# Patient Record
Sex: Female | Born: 1980 | Race: White | Hispanic: No | Marital: Single | Smoking: Never smoker
Health system: Southern US, Community
[De-identification: ages and names within clinical notes are randomized; demographics above are authoritative.]

## PROBLEM LIST (undated history)

## (undated) DIAGNOSIS — L732 Hidradenitis suppurativa: Secondary | ICD-10-CM

## (undated) DIAGNOSIS — Z9109 Other allergy status, other than to drugs and biological substances: Secondary | ICD-10-CM

## (undated) DIAGNOSIS — E039 Hypothyroidism, unspecified: Secondary | ICD-10-CM

## (undated) DIAGNOSIS — E785 Hyperlipidemia, unspecified: Secondary | ICD-10-CM

## (undated) DIAGNOSIS — K37 Unspecified appendicitis: Secondary | ICD-10-CM

## (undated) DIAGNOSIS — I10 Essential (primary) hypertension: Secondary | ICD-10-CM

## (undated) DIAGNOSIS — K219 Gastro-esophageal reflux disease without esophagitis: Secondary | ICD-10-CM

## (undated) DIAGNOSIS — D649 Anemia, unspecified: Secondary | ICD-10-CM

## (undated) HISTORY — DX: Hyperlipidemia, unspecified: E78.5

## (undated) HISTORY — DX: Anemia, unspecified: D64.9

## (undated) HISTORY — DX: Gastro-esophageal reflux disease without esophagitis: K21.9

---
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
## 2002-08-19 ENCOUNTER — Ambulatory Visit (HOSPITAL_COMMUNITY): Admission: RE | Admit: 2002-08-19 | Discharge: 2002-08-19 | Payer: Self-pay | Admitting: Gastroenterology

## 2002-08-25 ENCOUNTER — Emergency Department (HOSPITAL_COMMUNITY): Admission: EM | Admit: 2002-08-25 | Discharge: 2002-08-26 | Payer: Self-pay | Admitting: Emergency Medicine

## 2005-04-08 ENCOUNTER — Emergency Department (HOSPITAL_COMMUNITY): Admission: EM | Admit: 2005-04-08 | Discharge: 2005-04-08 | Payer: Self-pay | Admitting: Emergency Medicine

## 2005-10-15 ENCOUNTER — Emergency Department (HOSPITAL_COMMUNITY): Admission: EM | Admit: 2005-10-15 | Discharge: 2005-10-16 | Payer: Self-pay | Admitting: Emergency Medicine

## 2010-06-10 ENCOUNTER — Emergency Department (HOSPITAL_COMMUNITY): Admission: EM | Admit: 2010-06-10 | Discharge: 2010-06-10 | Payer: Self-pay | Admitting: Emergency Medicine

## 2011-05-23 ENCOUNTER — Inpatient Hospital Stay (HOSPITAL_COMMUNITY)
Admission: EM | Admit: 2011-05-23 | Discharge: 2011-05-25 | DRG: 343 | Disposition: A | Discharge status: Home / Self Care | Payer: Self-pay | Attending: General Surgery | Admitting: General Surgery

## 2011-05-23 DIAGNOSIS — K3580 Unspecified acute appendicitis: Principal | ICD-10-CM | POA: Diagnosis present

## 2011-05-23 LAB — COMPREHENSIVE METABOLIC PANEL
Albumin: 4 g/dL (ref 3.5–5.2)
Alkaline Phosphatase: 102 U/L (ref 39–117)
BUN: 7 mg/dL (ref 6–23)
CO2: 24 mEq/L (ref 19–32)
Chloride: 101 mEq/L (ref 96–112)
Creatinine, Ser: 0.58 mg/dL (ref 0.50–1.10)
GFR calc non Af Amer: >60 mL/min (ref >60)
Potassium: 3.7 mEq/L (ref 3.5–5.1)
Total Bilirubin: 0.5 mg/dL (ref 0.3–1.2)

## 2011-05-23 LAB — CBC
HCT: 40.4 % (ref 36.0–46.0)
Hemoglobin: 13.5 g/dL (ref 12.0–15.0)
MCH: 28.8 pg (ref 26.0–34.0)
MCV: 86.1 fL (ref 78.0–100.0)
RBC: 4.69 MIL/uL (ref 3.87–5.11)
WBC: 15.9 10*3/uL — ABNORMAL HIGH (ref 4.0–10.5)

## 2011-05-23 LAB — DIFFERENTIAL
Eosinophils Absolute: 0.1 10*3/uL (ref 0.0–0.7)
Lymphocytes Relative: 14 % (ref 12–46)
Lymphs Abs: 2.2 10*3/uL (ref 0.7–4.0)
Monocytes Relative: 6 % (ref 3–12)
Neutro Abs: 12.6 10*3/uL — ABNORMAL HIGH (ref 1.7–7.7)
Neutrophils Relative %: 79 % — ABNORMAL HIGH (ref 43–77)

## 2011-05-23 LAB — URINALYSIS, ROUTINE W REFLEX MICROSCOPIC
Bilirubin Urine: NEGATIVE
Glucose, UA: NEGATIVE mg/dL
Hgb urine dipstick: NEGATIVE
Ketones, ur: NEGATIVE mg/dL
Specific Gravity, Urine: 1.014 (ref 1.005–1.030)
pH: 5.5 (ref 5.0–8.0)

## 2011-05-24 ENCOUNTER — Other Ambulatory Visit (INDEPENDENT_AMBULATORY_CARE_PROVIDER_SITE_OTHER): Payer: Self-pay | Admitting: General Surgery

## 2011-05-24 ENCOUNTER — Emergency Department (HOSPITAL_COMMUNITY): Payer: Self-pay

## 2011-05-24 ENCOUNTER — Encounter (HOSPITAL_COMMUNITY): Payer: Self-pay

## 2011-05-24 DIAGNOSIS — K3580 Unspecified acute appendicitis: Secondary | ICD-10-CM

## 2011-05-24 DIAGNOSIS — R112 Nausea with vomiting, unspecified: Secondary | ICD-10-CM

## 2011-05-24 DIAGNOSIS — R1031 Right lower quadrant pain: Secondary | ICD-10-CM

## 2011-05-24 HISTORY — PX: APPENDECTOMY: SHX54

## 2011-05-24 LAB — MRSA PCR SCREENING: MRSA by PCR: NEGATIVE

## 2011-05-24 MED ORDER — IOHEXOL 300 MG/ML  SOLN
100.0000 mL | Freq: Once | INTRAMUSCULAR | Status: AC | PRN
Start: 2011-05-24 — End: 2011-05-24
  Administered 2011-05-24: 100 mL via INTRAVENOUS

## 2011-05-25 NOTE — Op Note (Signed)
[name], [name]                ACCOUNT NO.:  [id number]  MEDICAL RECORD NO.:  [id number]  LOCATION:  1507                         FACILITY:  [facility]  PHYSICIAN:  Adolph Pollack, M.D.DATE OF BIRTH:  1981-03-23  DATE OF PROCEDURE:  05/24/2011 DATE OF DISCHARGE:                              OPERATIVE REPORT   PREOPERATIVE DIAGNOSIS:  Acute appendicitis.  POSTOPERATIVE DIAGNOSIS:  Acute appendicitis.  PROCEDURE:  Laparoscopic appendectomy.  SURGEON:  Adolph Pollack, M.D.  ANESTHESIA:  General.  INDICATIONS:  This is a 30 year old female with 3-day history of progressively increasing periumbilical pain that had migrated to the right lower quadrant.  She was sent to [facility] Emergency Department, was found to have acute appendicitis by exam and CT scan, is now brought to the operating room for the above procedure.  TECHNIQUE:  She was brought to the operative room, placed supine on the operating table and general anesthetic was administered.  A Foley catheter was inserted into the bladder.  The abdominal wall sterilely prepped and draped.  Superior to the umbilicus, I infiltrated Marcaine solution into the subcutaneous tissues.  A small supraumbilical incision was made through the skin and subcutaneous tissue down to the fascia.  A small incision was made in the fascia, which was then retracted anteriorly.  The peritoneal cavity was then entered.  A pursestring suture of 0 Vicryl was placed around the fascial edges.  A Hassan trocar was introduced to the peritoneal cavity.  Pneumoperitoneum created by insufflation of CO2 gas.  Laparoscope was introduced and there was no underlying bleeding or organ injury.  There was no evidence of abscess or purulent drainage.  A 5-mm trocar was then placed in the left lower quadrant and I manipulated the cecum and noted adherent appendix to the antimesenteric fat of the distal ileum as well as the sidewall, which I was able to  mobilize free using blunt dissection.  A 5-mm trocar was then placed in the right upper quadrant.  I grasped the mesoappendix and retracted anteriorly. Using harmonic scalpel, I divided the mesoappendix down to the base of the appendix.  Using an Endo-GIA stapler, I then amputated the appendix off the cecum taking a small cuff of cecum.  The appendix was then placed in Endopouch bag and it was removed through the supraumbilical incision.  The supraumbilical port was replaced.  I then inspected the staple line and it was solid without evidence of leak or bleeding.  I then copiously irrigated out the right lower quadrant and evacuated the fluid.  Following this, I removed the supraumbilical trocar and left lower quadrant trocar.  Under laparoscopic vision, I closed the supraumbilical fascial defect by tightening up and tying down the pursestring suture.  The CO2 gas was released and the trocar was removed.  The skin incisions were then closed with 4-0 Monocryl subcuticular stitches.  Steri-Strips and sterile dressings were applied.  She tolerated the procedure well without any apparent complications and was taken to recovery room in satisfactory condition.     Adolph Pollack, M.D.     Kari Baars  D:  05/24/2011  T:  05/24/2011  Job:  [id number]  Electronically Signed by  Avel Peace M.D. on 05/25/2011 05:16:25 PM

## 2011-05-25 NOTE — H&P (Signed)
Harper, Meagan                ACCOUNT NO.:  [id number]  MEDICAL RECORD NO.:  [id number]  LOCATION:  WLED                         FACILITY:  [facility]  PHYSICIAN:  Adolph Pollack, M.D.DATE OF BIRTH:  11/21/1980  DATE OF ADMISSION:  05/23/2011 DATE OF DISCHARGE:                             HISTORY & PHYSICAL   REASON FOR ADMISSION:  Acute appendicitis.  HISTORY OF PRESENT ILLNESS:  [name] is a 30 year old female with the onset of periumbilical abdominal pain since May 20, 2011.  It progressively worsened and then radiated and then migrated to the right lower quadrant.  She had some nausea and low grade fever and anorexia. She presented to the emergency department for evaluation and was noted to have an elevation of her white blood cell count and right lower quadrant tenderness.  The CT scan demonstrated acute appendicitis.  We were asked to see her for that reason.  PAST MEDICAL HISTORY: 1. Gastroesophageal reflux disease. 2. Asthma.  PREVIOUS OPERATIONS:  Bilateral myringotomy tubes.  ALLERGIES:  She has sensitivity to ASPIRIN and IBUPROFEN.  Also, to [facility].  MEDICATIONS: 1. Iron. 2. Prilosec. 3. Albuterol inhaler p.r.n..  SOCIAL HISTORY:  She is single and working.  Her family is here with her.  No tobacco or alcohol use.  REVIEW OF SYSTEMS:  CARDIAC:  No hypertension or heart disease. PULMONARY:  No pneumonia, TB. GI: No peptic ulcer disease.  No diverticulitis.  No hepatitis. GU:  No kidney stones.  No dysuria, hematuria. ENDOCRINE:  No diabetes or hypercholesterolemia. NEUROLOGIC:  No seizures. HEMATOLOGIC:  No bleeding disorders or blood clots.  PHYSICAL EXAMINATION:  GENERAL:  An overweight female, who is in no acute distress currently.  She does receive pain medication.  She is awake, alert and cooperative. VITAL SIGNS:  Temperature is 99.2, blood pressure is 135/87, pulse 99, respiratory rate 18, O2 sats 99% on room air. SKIN:  Warm, dry.  No  jaundice. HEENT:  Normocephalic, atraumatic. NECK:  Supple without masses or obvious thyroid nodules. RESPIRATORY:  Breath sounds are equal and clear.  Respirations unlabored. CARDIOVASCULAR:  Heart demonstrates slightly increased rate with regular rhythm.  There is no JVD. ABDOMEN:  Soft.  There is some mild tenderness to palpation and percussion of the right lower quadrant with no palpable mass.  No hernia and active bowel sounds are noted. MUSCULOSKELETAL:  No edema.  Good range of motion and muscle tone. SKIN:  No jaundice.  LABORATORY DATA:  White cell count 15,900.  Hemoglobin 13.5.  Glucose is 102 except other than electrolytes and liver function tests are within normal limits.  Urine pregnancy test is negative.  Urinalysis negative.  DIAGNOSTIC STUDIES:  CT scan demonstrates thickened appendix with some inflammatory changes, but no evidence of abscess, a very small amount of free pelvic fluid.  IMPRESSION:  Acute appendicitis - Does not appear perforated at this time.  No signs of diffuse peritonitis.  PLAN:  Admit to the hospital.  Start IV antibiotics (IV cefoxitin has been given in the emergency department).  Laparoscopic possible open appendectomy.  I have discussed the procedure risks with her and her family.  Risks include but are not limited to bleeding,  infection, wound healing problems, anesthesia, accidental damage to intra-abdominal organs.  We also discussed aftercare and off work time.  She seems to understand and agrees with the plan.     Adolph Pollack, M.D.     Kari Baars  D:  05/24/2011  T:  05/24/2011  Job:  [id number]  Electronically Signed by Avel Peace M.D. on 05/25/2011 05:16:00 PM

## 2011-06-07 ENCOUNTER — Encounter (INDEPENDENT_AMBULATORY_CARE_PROVIDER_SITE_OTHER): Payer: Self-pay

## 2011-06-07 ENCOUNTER — Telehealth (INDEPENDENT_AMBULATORY_CARE_PROVIDER_SITE_OTHER): Payer: Self-pay

## 2011-06-07 NOTE — Telephone Encounter (Signed)
Pt called requesting a RTW note to be faxed to her job b/c her follow up appt has been changed by our office. I created the note in epic and faxed to pt per her request fx#[phone]/ [facility]

## 2011-06-08 NOTE — Discharge Summary (Signed)
  [name], [name]                ACCOUNT NO.:  [id number]  MEDICAL RECORD NO.:  [id number]  LOCATION:  1507                         FACILITY:  [facility]  PHYSICIAN:  Anselm Pancoast. Rasheda Ledger, M.D.DATE OF BIRTH:  Mar 24, 1981  DATE OF ADMISSION:  05/23/2011 DATE OF DISCHARGE:  05/25/2011                              DISCHARGE SUMMARY   ADMISSION DIAGNOSES: 1. Acute appendicitis. 2. History of gastroesophageal reflux disease. 3. History of asthma.  DISCHARGE DIAGNOSES: 1. Acute appendicitis. 2. History of gastroesophageal reflux disease. 3. History of asthma.  PROCEDURE:  Laparoscopic appendectomy on May 24, 2011.  BRIEF HISTORY:  The patient is a 30 year old female with a history of periumbilical pain started on July 27th, became progressively worse, radiated and migrated to right lower quadrant.  She has had some nausea and low grade fevers and anorexia.  She presented to the ER where a CT demonstrated acute appendicitis.  Dr. Abbey Chatters was asked to see the patient and after evaluation, recommended she go to the OR for a laparoscopic appendectomy.  She was started on IV antibiotics in the ER.  She agrees to procedure and was subsequently admitted.  PAST MEDICAL HISTORY:  As above.  ALLERGIES: 1. ASPIRIN. 2. IBUPROFEN. 3. SHELLFISH.  MEDICATIONS:  Iron supplement, Prilosec, and albuterol p.r.n.  For further history and physical, please see the dictated note.  HOSPITAL COURSE:  The patient was taken the OR in the early a.m. of May 24, 2011, at which time Dr. Abbey Chatters did a laparoscopic appendectomy. The patient tolerated the procedure well, was transferred to the floor postoperatively.  She has been mobilized advanced.  She ate eggs and muffin for breakfast.  Her wounds were examined by Dr. Zachery Dakins, he felt her abdomen was soft and was healing nicely and it was his impression, The patient could be discharged home today.  DISCHARGE MEDICATIONS:  She will continue her  albuterol inhaler 2 puffs q.4 p.r.n., over-the-counter iron 2 tablets daily, Prilosec 2 tablets daily, Tylenol Extra Strength p.r.n., and new prescription will be Percocet 1 to 2 p.o. q.4 h. p.r.n., #40, no refills.  She will follow up in the office in about 2 weeks and we will work on making that appointment for her.  She is to call if she has any problems with fever, urinary retention, abdominal pain, or problems with her wound sites.  CONDITION ON DISCHARGE:  Improved.     Eber Hong, P.A.   ______________________________ Anselm Pancoast. Zachery Dakins, M.D.    WDJ/MEDQ  D:  05/25/2011  T:  05/25/2011  Job:  [id number]  cc:   Lovenia Kim, D.O. Fax: [phone]  Electronically Signed by Sherrie George P.A. on 05/30/2011 10:23:36 PM Electronically Signed by Consuello Bossier M.D. on 06/08/2011 09:18:59 AM

## 2011-06-09 ENCOUNTER — Telehealth (INDEPENDENT_AMBULATORY_CARE_PROVIDER_SITE_OTHER): Payer: Self-pay | Admitting: General Surgery

## 2011-06-09 NOTE — Telephone Encounter (Signed)
Patient called s/p lap appy on 05/24/11. States her incision around her belly button is a little red and tender. Started yesterday. No spreading of the redness. No drainage. No fever. I explained to patient that this far out would be abnormal to develop an infection and the incision is probably just irritated since the redness is just right at the incision. Patient still wants her appt moved up if possible. Scheduled for 06/22/11. I could not find any openings prior to this. Please advise.

## 2011-06-22 ENCOUNTER — Encounter (INDEPENDENT_AMBULATORY_CARE_PROVIDER_SITE_OTHER): Payer: Self-pay | Admitting: General Surgery

## 2011-06-22 ENCOUNTER — Encounter (INDEPENDENT_AMBULATORY_CARE_PROVIDER_SITE_OTHER): Payer: Self-pay

## 2011-06-22 ENCOUNTER — Ambulatory Visit (INDEPENDENT_AMBULATORY_CARE_PROVIDER_SITE_OTHER): Payer: Self-pay | Admitting: General Surgery

## 2011-06-22 ENCOUNTER — Other Ambulatory Visit (INDEPENDENT_AMBULATORY_CARE_PROVIDER_SITE_OTHER): Payer: Self-pay

## 2011-06-22 VITALS — BP 112/86 | HR 62

## 2011-06-22 DIAGNOSIS — K37 Unspecified appendicitis: Secondary | ICD-10-CM

## 2011-06-22 HISTORY — DX: Unspecified appendicitis: K37

## 2011-06-22 NOTE — Progress Notes (Signed)
Operation: Laparoscopic appendectomy  Date: May 24, 2011  Pathology: Acute appendicitis  HPI:  [name] is here for her first postoperative visit. She is doing well and has no complaints.   Physical Exam: Abdomen-soft, incisions clean, dry, intact. No hernia   Assessment: Doing well following appendectomy.  Plan: Activities as tolerated. Return visit p.r.n.

## 2011-06-22 NOTE — Patient Instructions (Signed)
May return to work without restrictions. Activities as tolerated.

## 2011-09-14 IMAGING — CT CT ABD-PELV W/ CM
2 of 4 series · 17 of 46 positions shown, 19 images · IV contrast (agent unspecified)
Comparison: None.

CLINICAL DATA: Right lower quadrant pain.  Elevated white count

CT ABDOMEN AND PELVIS WITH CONTRAST
TECHNIQUE: Multidetector CT imaging of the abdomen and pelvis was
performed following the standard protocol during bolus
administration of intravenous contrast.
Contrast: 100 ml Smnipaque-BKK IV

[Series 2: rtn ap with st · axial · 0.60mm/px · z∈[-218,+156]mm · 14 of 83 slices shown, 16 images]
[im 4/83  soft-tissue]
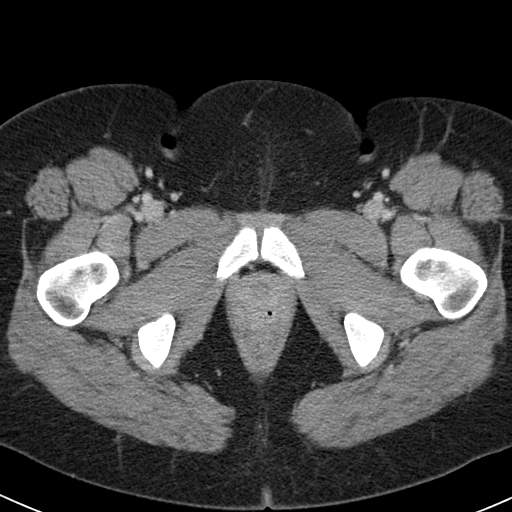
[im 4/83  bone]
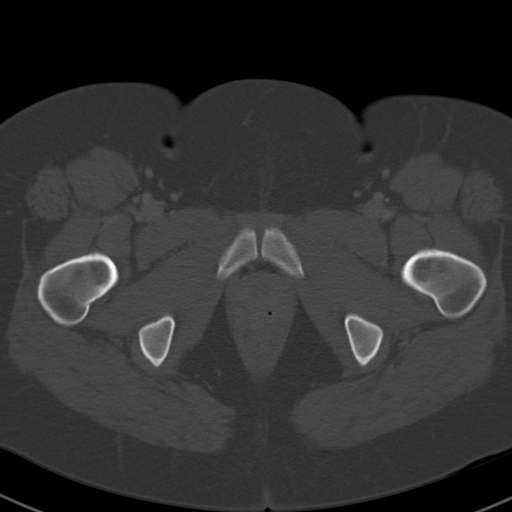
[im 10/83  soft-tissue]
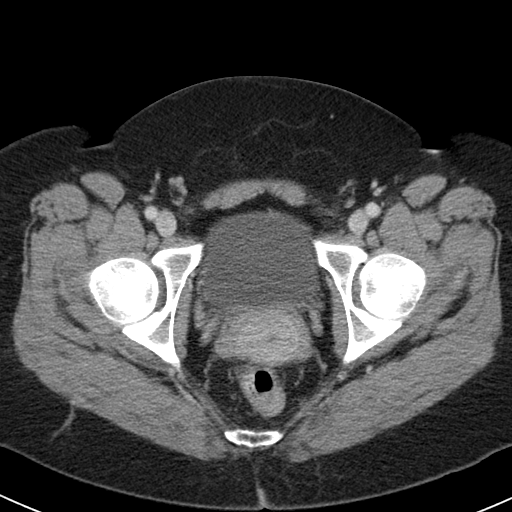
[im 17/83  soft-tissue]
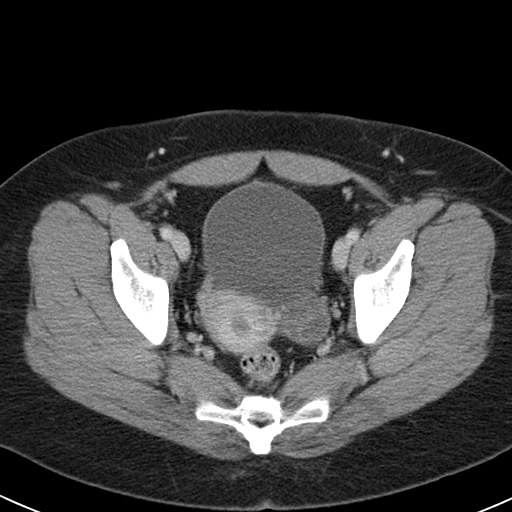
[im 23/83  soft-tissue]
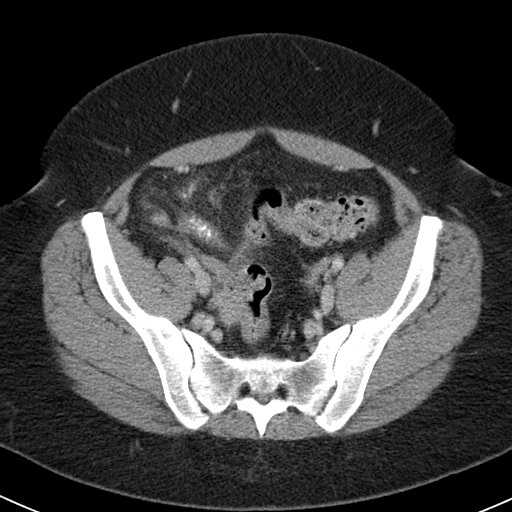
[im 27/83  soft-tissue]
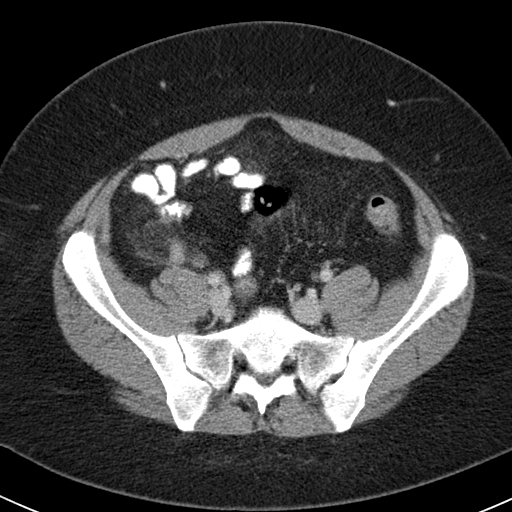
[im 33/83  soft-tissue]
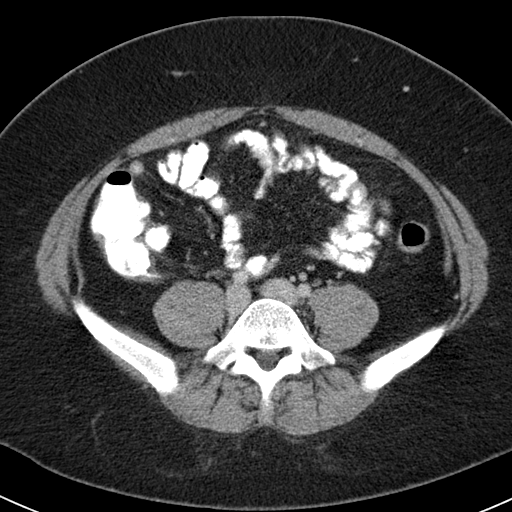
[im 40/83  soft-tissue]
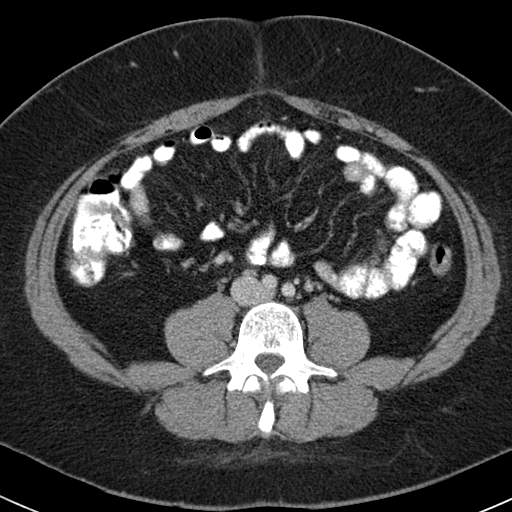
[im 43/83  soft-tissue]
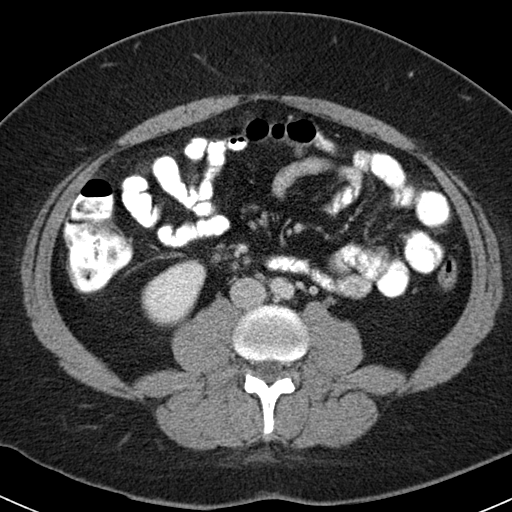
[im 50/83  soft-tissue]
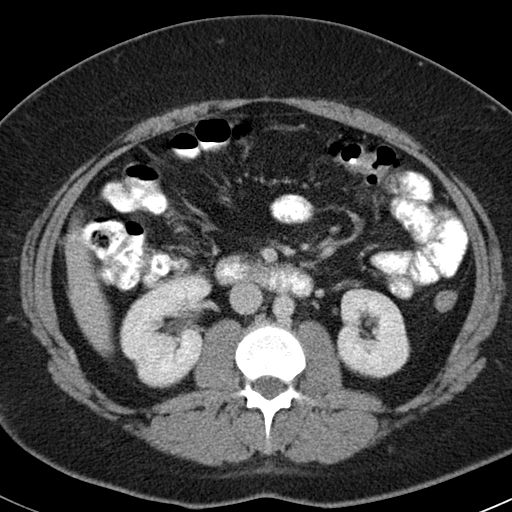
[im 50/83  bone]
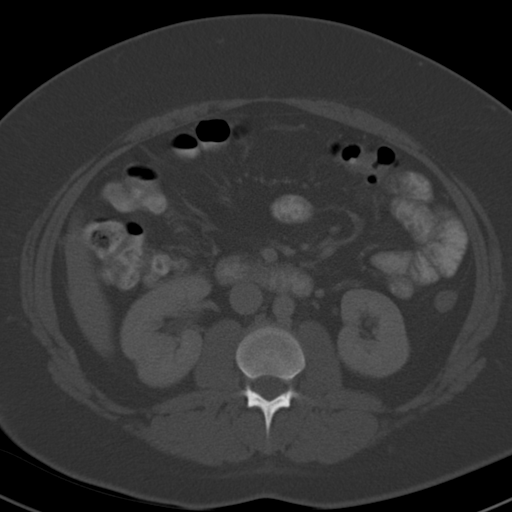
[im 56/83  soft-tissue]
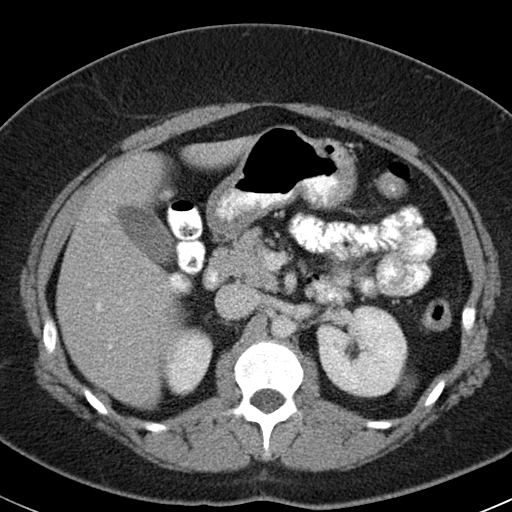
[im 63/83  soft-tissue]
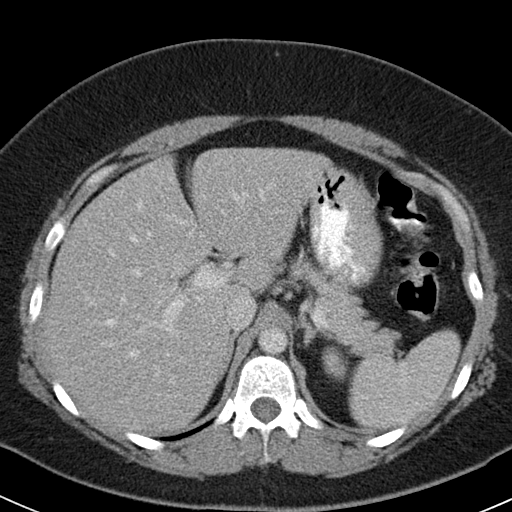
[im 66/83  soft-tissue]
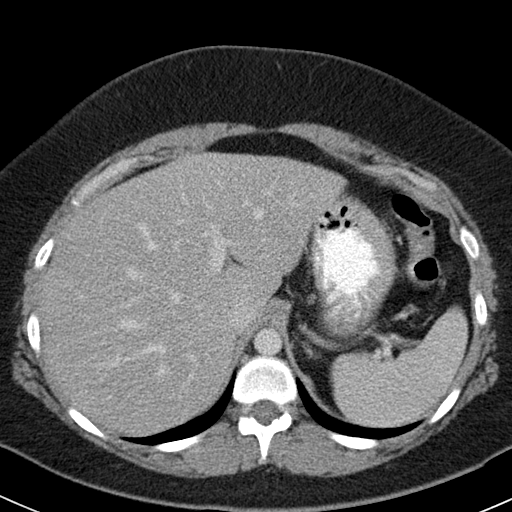
[im 73/83  soft-tissue]
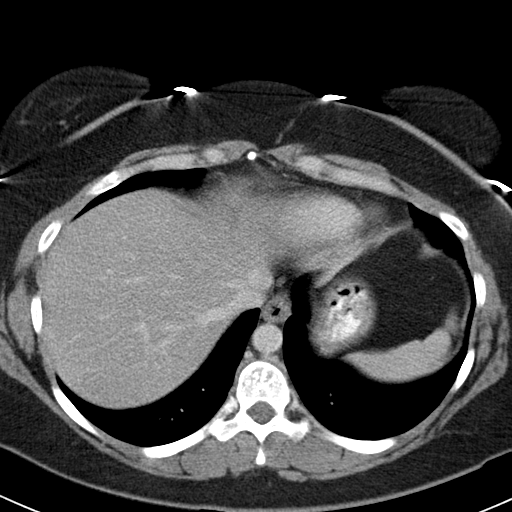
[im 79/83  soft-tissue]
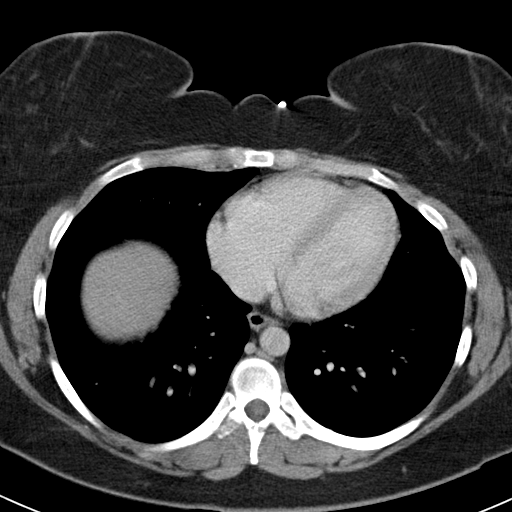

[Series 602: <mpr thick range> · coronal · 0.84mm/px · 3 of 77 slices shown]
[im 26/77  soft-tissue]
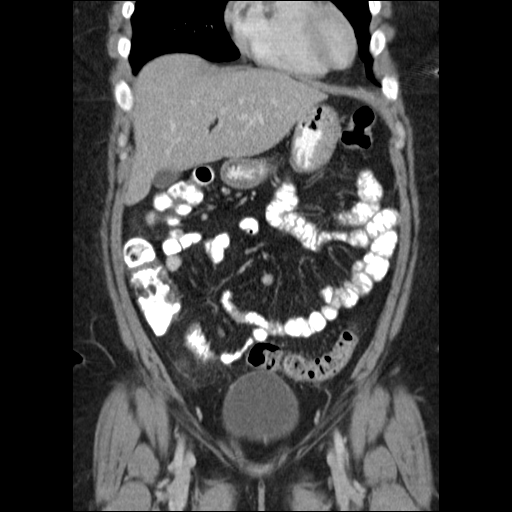
[im 34/77  soft-tissue]
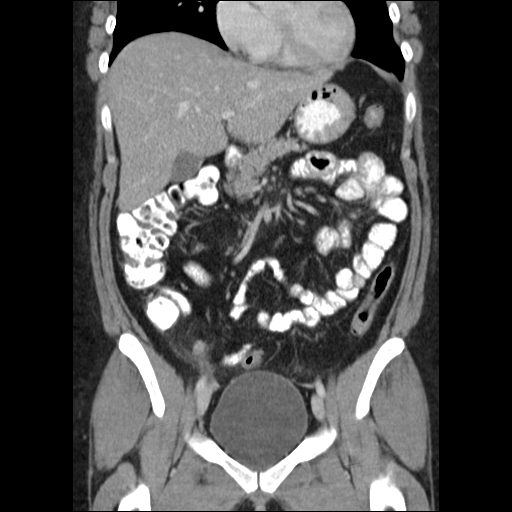
[im 43/77  soft-tissue]
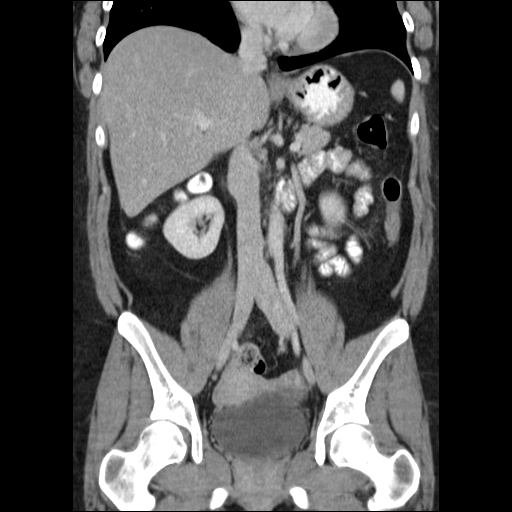

[17 of 46 positions shown; findings below may reference images not displayed]

FINDINGS: Findings compatible with acute appendicitis.  The
appendix is thickened with edema in the periappendiceal fat.
Minimal free fluid in the cul-de-sac.  No abscess is present.
Negative for bowel obstruction.

Liver and spleen are normal.  Gallbladder and bile ducts are
normal.  Pancreas is normal.  Kidneys are normal.  Negative for
mass or adenopathy. Left ovary is mildly enlarged due to small
follicles.  Uterus is normal.
IMPRESSION: Acute appendicitis without abscess.

## 2013-10-07 ENCOUNTER — Ambulatory Visit (INDEPENDENT_AMBULATORY_CARE_PROVIDER_SITE_OTHER): Payer: BC Managed Care – PPO | Admitting: Emergency Medicine

## 2013-10-07 ENCOUNTER — Encounter: Payer: Self-pay | Admitting: Emergency Medicine

## 2013-10-07 VITALS — BP 126/94 | HR 100 | Temp 98.6°F | Resp 16 | Wt 205.0 lb

## 2013-10-07 DIAGNOSIS — J329 Chronic sinusitis, unspecified: Secondary | ICD-10-CM

## 2013-10-07 DIAGNOSIS — J309 Allergic rhinitis, unspecified: Secondary | ICD-10-CM

## 2013-10-07 MED ORDER — CEPHALEXIN 500 MG PO CAPS: 500.0000 mg | ORAL_CAPSULE | Freq: Three times a day (TID) | ORAL | Status: AC

## 2013-10-07 NOTE — Patient Instructions (Signed)
Sinusitis Sinusitis is redness, soreness, and puffiness (inflammation) of the air pockets in the bones of your face (sinuses). The redness, soreness, and puffiness can cause air and mucus to get trapped in your sinuses. This can allow germs to grow and cause an infection.  HOME CARE   Drink enough fluids to keep your pee (urine) clear or pale yellow.  Use a humidifier in your home.  Run a hot shower to create steam in the bathroom. Sit in the bathroom with the door closed. Breathe in the steam 3 4 times a day.  Put a warm, moist washcloth on your face 3 4 times a day, or as told by your doctor.  Use salt water sprays (saline sprays) to wet the thick fluid in your nose. This can help the sinuses drain.  Only take medicine as told by your doctor. GET HELP RIGHT AWAY IF:   Your pain gets worse.  You have very bad headaches.  You are sick to your stomach (nauseous).  You throw up (vomit).  You are very sleepy (drowsy) all the time.  Your face is puffy (swollen).  Your vision changes.  You have a stiff neck.  You have trouble breathing. MAKE SURE YOU:   Understand these instructions.  Will watch your condition.  Will get help right away if you are not doing well or get worse. Document Released: 03/28/2008 Document Revised: 07/04/2012 Document Reviewed: 05/15/2012 [employer] Patient Information 2014 [employer].  

## 2013-10-08 NOTE — Progress Notes (Signed)
   Subjective:    Patient ID: [name], female    DOB: 1981/04/10, 32 y.o.   MRN: [id number]  HPI Comments: 32 yo female with left sided facial pain and swelling x 3days. She has had cold for over 2 weeks and now has color with production. She has been taking Zyrtec and mucinex w/o relief.  Sinusitis Associated symptoms include congestion, headaches and sinus pressure.  Headache  Associated symptoms include sinus pressure.    Current Outpatient Prescriptions on File Prior to Visit  Medication Sig Dispense Refill  . IRON PO Take 45 mg by mouth 2 (two) times daily.         No current facility-administered medications on file prior to visit.  OMEPRAZOLE 20  ALLERGIES Aspirin; Avelox; Ibuprofen; and Shellfish allergy  Past Medical History  Diagnosis Date  . Anemia   . Asthma   . GERD (gastroesophageal reflux disease)   . Hyperlipidemia       Review of Systems  HENT: Positive for congestion and sinus pressure.   Respiratory: Negative for choking.   Neurological: Positive for headaches.  All other systems reviewed and are negative.    BP 126/94  Pulse 100  Temp(Src) 98.6 F (37 C) (Temporal)  Resp 16  Wt 205 lb (92.987 kg)  LMP 09/19/2013     Objective:   Physical Exam  Nursing note and vitals reviewed. Constitutional: She is oriented to person, place, and time. She appears well-developed and well-nourished.  HENT:  Head: Normocephalic and atraumatic.  Right Ear: External ear normal.  Left Ear: External ear normal.  Nose: Nose normal.  Mouth/Throat: Oropharynx is clear and moist. No oropharyngeal exudate.  Left maxillary tenderness with mild edema  Eyes: Conjunctivae and EOM are normal.  Neck: Normal range of motion.  Cardiovascular: Normal rate, regular rhythm, normal heart sounds and intact distal pulses.   Pulmonary/Chest: Effort normal and breath sounds normal.  Musculoskeletal: Normal range of motion.  Lymphadenopathy:    She has no cervical  adenopathy.  Neurological: She is alert and oriented to person, place, and time.  Skin: Skin is warm and dry.  Psychiatric: She has a normal mood and affect. Judgment normal.          Assessment & Plan:  Sinusitits/ Allergic Rhinitis- Keflex 500 mg TID #30 with food, Increase H2o, continue Mucinex AD, switch to Allegra AD.

## 2014-01-17 ENCOUNTER — Other Ambulatory Visit: Payer: Self-pay | Admitting: Emergency Medicine

## 2014-01-17 ENCOUNTER — Telehealth: Payer: Self-pay | Admitting: *Deleted

## 2014-01-17 NOTE — Telephone Encounter (Signed)
REFILL :  EPI PEN  &  VENTOLIN     [facility] [place] ( [address] )

## 2014-01-19 NOTE — Telephone Encounter (Signed)
We do not have theses prescriptions listed, she will need to call pharmacy and have them request refill

## 2015-02-18 ENCOUNTER — Encounter: Payer: Self-pay | Admitting: Internal Medicine

## 2015-02-18 ENCOUNTER — Ambulatory Visit (INDEPENDENT_AMBULATORY_CARE_PROVIDER_SITE_OTHER): Payer: Self-pay | Admitting: Internal Medicine

## 2015-02-18 VITALS — BP 152/86 | HR 86 | Temp 98.4°F | Resp 18 | Ht 58.25 in | Wt 221.0 lb

## 2015-02-18 DIAGNOSIS — R21 Rash and other nonspecific skin eruption: Secondary | ICD-10-CM

## 2015-02-18 MED ORDER — TRIAMCINOLONE ACETONIDE 0.1 % EX CREA: 1.0000 "application " | TOPICAL_CREAM | Freq: Two times a day (BID) | CUTANEOUS | Status: DC

## 2015-02-18 MED ORDER — PREDNISONE 20 MG PO TABS: ORAL_TABLET | ORAL | Status: DC

## 2015-02-18 NOTE — Progress Notes (Signed)
   Subjective:    Patient ID: [name], female    DOB: April 27, 1981, 34 y.o.   MRN: [id number]  Rash Pertinent negatives include no fatigue, fever, shortness of breath or vomiting.   Patient is a 34 y.o. Female who presents to the office for evaluation of a rash. She developed it on Monday.  She reports that it has been spreading.  It is on her neck, arms, chest and back.  It is itchy, red.  She reports that she doe sometimes have a similar rash when she is in the sun and was exposed to the sun on Saturday.  She has tried benadryl and also some hydrocortisone cream at home.  Nothing makes it worse.  She has no changes in personal products, no travel, and no plants. She reports no new foods.      Review of Systems  Constitutional: Negative for fever, chills and fatigue.  HENT: Negative for facial swelling.   Respiratory: Negative for chest tightness, shortness of breath and wheezing.   Gastrointestinal: Negative for nausea and vomiting.  Skin: Positive for color change and rash. Negative for pallor and wound.       Objective:   Physical Exam  Constitutional: She is oriented to person, place, and time. She appears well-developed and well-nourished. No distress.  HENT:  Head: Normocephalic and atraumatic.  Mouth/Throat: Oropharynx is clear and moist. No oropharyngeal exudate.  Eyes: Conjunctivae and EOM are normal. Pupils are equal, round, and reactive to light. No scleral icterus.  Neck: Normal range of motion. Neck supple. No JVD present. No thyromegaly present.  Cardiovascular: Normal rate, regular rhythm, normal heart sounds and intact distal pulses.  Exam reveals no gallop and no friction rub.   No murmur heard. Pulmonary/Chest: Effort normal and breath sounds normal. No respiratory distress. She has no wheezes. She has no rales. She exhibits no tenderness.  Musculoskeletal: Normal range of motion.  Lymphadenopathy:    She has no cervical adenopathy.  Neurological: She is alert  and oriented to person, place, and time.  Skin: Skin is warm and dry. Rash noted. She is not diaphoretic.  Erythematous maculopapular rash of the chest neck and bilateral arms.  Blanches with pressure, no purpura or petechia.  There is no tenderness to palpation.    Psychiatric: She has a normal mood and affect. Her behavior is normal. Judgment and thought content normal.  Nursing note and vitals reviewed.   Filed Vitals:   02/18/15 0934  BP: 152/86  Pulse: 86  Temp: 98.4 F (36.9 C)  Resp: 18         Assessment & Plan:    1. Rash and nonspecific skin eruption  Likely rash from sun exposure on Sunday vs. Contact dermatitis.  Will treat with steroids, oral antihistamine and topical triamcinolone for itching.    - predniSONE (DELTASONE) 20 MG tablet; 3 tabs po day one, then 2 tabs daily x 4 days  Dispense: 11 tablet; Refill: 0 - triamcinolone cream (KENALOG) 0.1 %; Apply 1 application topically 2 (two) times daily.  Dispense: 30 g; Refill: 0 -OTC antihistamine

## 2015-02-18 NOTE — Patient Instructions (Signed)

## 2015-10-30 ENCOUNTER — Ambulatory Visit (INDEPENDENT_AMBULATORY_CARE_PROVIDER_SITE_OTHER): Payer: Self-pay | Admitting: Internal Medicine

## 2015-10-30 ENCOUNTER — Encounter: Payer: Self-pay | Admitting: Internal Medicine

## 2015-10-30 VITALS — BP 128/76 | HR 102 | Temp 97.8°F | Resp 16 | Ht 58.25 in | Wt 220.0 lb

## 2015-10-30 DIAGNOSIS — R21 Rash and other nonspecific skin eruption: Secondary | ICD-10-CM

## 2015-10-30 MED ORDER — PREDNISONE 20 MG PO TABS: ORAL_TABLET | ORAL | Status: DC

## 2015-10-30 MED ORDER — BETAMETHASONE VALERATE 0.1 % EX OINT: 1.0000 "application " | TOPICAL_OINTMENT | Freq: Two times a day (BID) | CUTANEOUS | Status: DC

## 2015-10-30 NOTE — Patient Instructions (Signed)
Eczema Eczema, also called atopic dermatitis, is a skin disorder that causes inflammation of the skin. It causes a red rash and dry, scaly skin. The skin becomes very itchy. Eczema is generally worse during the cooler winter months and often improves with the warmth of summer. Eczema usually starts showing signs in infancy. Some children outgrow eczema, but it may last through adulthood.  CAUSES  The exact cause of eczema is not known, but it appears to run in families. People with eczema often have a family history of eczema, allergies, asthma, or hay fever. Eczema is not contagious. Flare-ups of the condition may be caused by:   Contact with something you are sensitive or allergic to.   Stress. SIGNS AND SYMPTOMS  Dry, scaly skin.   Red, itchy rash.   Itchiness. This may occur before the skin rash and may be very intense.  DIAGNOSIS  The diagnosis of eczema is usually made based on symptoms and medical history. TREATMENT  Eczema cannot be cured, but symptoms usually can be controlled with treatment and other strategies. A treatment plan might include:  Controlling the itching and scratching.   Use over-the-counter antihistamines as directed for itching. This is especially useful at night when the itching tends to be worse.   Use over-the-counter steroid creams as directed for itching.   Avoid scratching. Scratching makes the rash and itching worse. It may also result in a skin infection (impetigo) due to a break in the skin caused by scratching.   Keeping the skin well moisturized with creams every day. This will seal in moisture and help prevent dryness. Lotions that contain alcohol and water should be avoided because they can dry the skin.   Limiting exposure to things that you are sensitive or allergic to (allergens).   Recognizing situations that cause stress.   Developing a plan to manage stress.  HOME CARE INSTRUCTIONS   Only take over-the-counter or  prescription medicines as directed by your health care provider.   Do not use anything on the skin without checking with your health care provider.   Keep baths or showers short (5 minutes) in warm (not hot) water. Use mild cleansers for bathing. These should be unscented. You may add nonperfumed bath oil to the bath water. It is best to avoid soap and bubble bath.   Immediately after a bath or shower, when the skin is still damp, apply a moisturizing ointment to the entire body. This ointment should be a petroleum ointment. This will seal in moisture and help prevent dryness. The thicker the ointment, the better. These should be unscented.   Keep fingernails cut short. Children with eczema may need to wear soft gloves or mittens at night after applying an ointment.   Dress in clothes made of cotton or cotton blends. Dress lightly, because heat increases itching.   A child with eczema should stay away from anyone with fever blisters or cold sores. The virus that causes fever blisters (herpes simplex) can cause a serious skin infection in children with eczema. SEEK MEDICAL CARE IF:   Your itching interferes with sleep.   Your rash gets worse or is not better within 1 week after starting treatment.   You see pus or soft yellow scabs in the rash area.   You have a fever.   You have a rash flare-up after contact with someone who has fever blisters.    This information is not intended to replace advice given to you by your health care   provider. Make sure you discuss any questions you have with your health care provider.   Document Released: 10/07/2000 Document Revised: 07/31/2013 Document Reviewed: 05/13/2013 Elsevier Interactive Patient Education 2016 [employer]  

## 2015-10-30 NOTE — Progress Notes (Signed)
   Subjective:    Patient ID: [name], female    DOB: Jan 12, 1981, 35 y.o.   MRN: [id number]  Rash Pertinent negatives include no fatigue, fever or vomiting.   Patient presents to the office for evaluation of a rash on her abdomen which has been going on for the past couple months.  She reports that the rash is around her belly button and seems to wax and wane.  She reports that she has been trying kenalog, hydrocortisone cream, essential oils and nothing helps.  No soaps, no new detergents, no new clothes.  She reports that rash gets worse with heat.  She reports that the kenalog cream helped the most.  Rash is itchy, red. No blisters, no discharge.  No similar rash with family or friends.  She does have a history of eczema.    Review of Systems  Constitutional: Negative for fever, chills and fatigue.  Gastrointestinal: Negative for nausea and vomiting.  Skin: Positive for rash. Negative for color change, pallor and wound.       Objective:   Physical Exam  Constitutional: She is oriented to person, place, and time. She appears well-developed and well-nourished. No distress.  HENT:  Head: Normocephalic.  Mouth/Throat: Oropharynx is clear and moist. No oropharyngeal exudate.  Eyes: Conjunctivae are normal. No scleral icterus.  Neck: Normal range of motion. Neck supple. No JVD present. No thyromegaly present.  Cardiovascular: Normal rate, regular rhythm, normal heart sounds and intact distal pulses.  Exam reveals no gallop and no friction rub.   No murmur heard. Pulmonary/Chest: Effort normal and breath sounds normal. No respiratory distress. She has no wheezes. She has no rales. She exhibits no tenderness.  Musculoskeletal: Normal range of motion.  Lymphadenopathy:    She has no cervical adenopathy.  Neurological: She is alert and oriented to person, place, and time.  Skin: Skin is warm and dry. Rash noted. She is not diaphoretic.     Nursing note and vitals reviewed.   Filed  Vitals:   10/30/15 1100  BP: 128/76  Pulse: 102  Temp: 97.8 F (36.6 C)  Resp: 16         Assessment & Plan:    1. Rash and nonspecific skin eruption -likely eczema -prednisone taper -betamethasone cream -call office if no better in 10 days. -discussed avoiding hot water using vaseline, and soaps without detergent.

## 2016-03-16 ENCOUNTER — Ambulatory Visit (INDEPENDENT_AMBULATORY_CARE_PROVIDER_SITE_OTHER): Payer: Self-pay | Admitting: Internal Medicine

## 2016-03-16 ENCOUNTER — Encounter: Payer: Self-pay | Admitting: Internal Medicine

## 2016-03-16 VITALS — BP 142/88 | HR 86 | Temp 98.2°F | Resp 16 | Ht 59.25 in | Wt 223.0 lb

## 2016-03-16 DIAGNOSIS — G43909 Migraine, unspecified, not intractable, without status migrainosus: Secondary | ICD-10-CM

## 2016-03-16 MED ORDER — METOCLOPRAMIDE HCL 10 MG PO TABS: 10.0000 mg | ORAL_TABLET | Freq: Three times a day (TID) | ORAL | Status: DC

## 2016-03-16 MED ORDER — ALBUTEROL SULFATE HFA 108 (90 BASE) MCG/ACT IN AERS: 2.0000 | INHALATION_SPRAY | RESPIRATORY_TRACT | Status: DC | PRN

## 2016-03-16 NOTE — Patient Instructions (Signed)
Please take reglan 1 tablet at the first sign of a headache.  Please take this with 25-50 mg of benadryl.

## 2016-03-16 NOTE — Progress Notes (Signed)
   Subjective:    Patient ID: [name], female    DOB: 1981-10-20, 35 y.o.   MRN: [id number]  Migraine  This is a new problem. The current episode started in the past 7 days. The problem occurs constantly. The problem has been waxing and waning. The pain is located in the bilateral and occipital region. The pain quality is similar to prior headaches. The quality of the pain is described as aching, dull and pulsating. The pain is at a severity of 6/10. The pain is moderate. Associated symptoms include nausea, neck pain, phonophobia and photophobia. Pertinent negatives include no fever, visual change or vomiting. Nothing aggravates the symptoms. She has tried acetaminophen for the symptoms. The treatment provided mild relief. Her past medical history is significant for migraine headaches and migraines in the family.   This is consistent with prior headaches.  Came on gradually.  Mild relief with some heating pad.     Review of Systems  Constitutional: Negative for fever.  Eyes: Positive for photophobia.  Gastrointestinal: Positive for nausea. Negative for vomiting.  Musculoskeletal: Positive for neck pain.        Objective:   Physical Exam  Constitutional: She is oriented to person, place, and time. She appears well-developed and well-nourished. No distress.  HENT:  Head: Normocephalic.  Mouth/Throat: Oropharynx is clear and moist. No oropharyngeal exudate.  Eyes: Conjunctivae are normal. No scleral icterus.  Neck: Normal range of motion. Neck supple. No JVD present. No Brudzinski's sign and no Kernig's sign noted. No thyromegaly present.  Cardiovascular: Normal rate, regular rhythm, normal heart sounds and intact distal pulses.  Exam reveals no gallop and no friction rub.   No murmur heard. Pulmonary/Chest: Effort normal and breath sounds normal. No respiratory distress. She has no wheezes. She has no rales. She exhibits no tenderness.  Abdominal: Soft. Bowel sounds are normal. She  exhibits no distension and no mass. There is no tenderness. There is no rebound and no guarding.  Musculoskeletal: Normal range of motion.  Lymphadenopathy:    She has no cervical adenopathy.  Neurological: She is alert and oriented to person, place, and time. She has normal strength. No cranial nerve deficit or sensory deficit. Coordination normal. GCS eye subscore is 4. GCS verbal subscore is 5. GCS motor subscore is 6.  Skin: Skin is warm and dry. She is not diaphoretic.  Psychiatric: She has a normal mood and affect. Her behavior is normal. Judgment and thought content normal.  Nursing note and vitals reviewed.   Filed Vitals:   03/16/16 1629  BP: 142/88  Pulse: 86  Temp: 98.2 F (36.8 C)  Resp: 16        Assessment & Plan:    1. Migraine without status migrainosus, not intractable, unspecified migraine type -onzetra sample given here -reglan and benadryl for future migraines. -neuro exam normal -no red flags

## 2016-03-31 ENCOUNTER — Ambulatory Visit (INDEPENDENT_AMBULATORY_CARE_PROVIDER_SITE_OTHER): Payer: Self-pay | Admitting: Physician Assistant

## 2016-03-31 ENCOUNTER — Encounter: Payer: Self-pay | Admitting: Physician Assistant

## 2016-03-31 VITALS — BP 140/90 | HR 99 | Temp 97.0°F | Resp 16 | Ht 59.25 in | Wt 223.0 lb

## 2016-03-31 DIAGNOSIS — H6692 Otitis media, unspecified, left ear: Secondary | ICD-10-CM

## 2016-03-31 MED ORDER — AMOXICILLIN 500 MG PO CAPS: 500.0000 mg | ORAL_CAPSULE | Freq: Three times a day (TID) | ORAL | Status: DC

## 2016-03-31 MED ORDER — PREDNISONE 20 MG PO TABS: ORAL_TABLET | ORAL | Status: DC

## 2016-03-31 NOTE — Patient Instructions (Signed)
Please take the prednisone to help decrease inflammation and therefore decrease symptoms. Take it it with food to avoid GI upset. It can cause increased energy but on the other hand it can make it hard to sleep at night so please take it AT NIGHT WITH DINNER, it takes 8-12 hours to start working so it will NOT affect your sleeping if you take it at night with your food!!  If you are diabetic it will increase your sugars so decrease carbs and monitor your sugars closely.     Otitis Media With Effusion Otitis media with effusion is the presence of fluid in the middle ear. This is a common problem in children, which often follows ear infections. It may be present for weeks or longer after the infection. Unlike an acute ear infection, otitis media with effusion refers only to fluid behind the ear drum and not infection. Children with repeated ear and sinus infections and allergy problems are the most likely to get otitis media with effusion. CAUSES  The most frequent cause of the fluid buildup is dysfunction of the eustachian tubes. These are the tubes that drain fluid in the ears to the back of the nose (nasopharynx). SYMPTOMS   The main symptom of this condition is hearing loss. As a result, you or your child may:  Listen to the TV at a loud volume.  Not respond to questions.  Ask "what" often when spoken to.  Mistake or confuse one sound or word for another.  There may be a sensation of fullness or pressure but usually not pain. DIAGNOSIS   Your health care provider will diagnose this condition by examining you or your child's ears.  Your health care provider may test the pressure in you or your child's ear with a tympanometer.  A hearing test may be conducted if the problem persists. TREATMENT   Treatment depends on the duration and the effects of the effusion.  Antibiotics, decongestants, nose drops, and cortisone-type drugs (tablets or nasal spray) may not be helpful.  Children  with persistent ear effusions may have delayed language or behavioral problems. Children at risk for developmental delays in hearing, learning, and speech may require referral to a specialist earlier than children not at risk.  You or your child's health care provider may suggest a referral to an ear, nose, and throat surgeon for treatment. The following may help restore normal hearing:  Drainage of fluid.  Placement of ear tubes (tympanostomy tubes).  Removal of adenoids (adenoidectomy). HOME CARE INSTRUCTIONS   Avoid secondhand smoke.  Infants who are breastfed are less likely to have this condition.  Avoid feeding infants while they are lying flat.  Avoid known environmental allergens.  Avoid people who are sick. SEEK MEDICAL CARE IF:   Hearing is not better in 3 months.  Hearing is worse.  Ear pain.  Drainage from the ear.  Dizziness. MAKE SURE YOU:   Understand these instructions.  Will watch your condition.  Will get help right away if you are not doing well or get worse.   This information is not intended to replace advice given to you by your health care provider. Make sure you discuss any questions you have with your health care provider.   Document Released: 11/17/2004 Document Revised: 10/31/2014 Document Reviewed: 05/07/2013 Elsevier Interactive Patient Education [employer]2016 Elsevier Inc.

## 2016-03-31 NOTE — Progress Notes (Signed)
   Subjective:    Patient ID: [name], female    DOB: [birth year], 35 y.o.   MRN: [id number]  HPI [age] y.o. obese white female with history of repeated ear infections presents with left ear pain. She was here for migraine 03/17/2015, told she had fluid in her ears at that time and started zyrtec 1 week ago. States that her left ear now feels completely stopped up, tender to touch, and has had yellow discharge. She feels feverish but only subjective.   Blood pressure 140/90, pulse 99, temperature 97 F (36.1 C), temperature source Temporal, resp. rate 16, height 4' 11.25" (1.505 m), weight 223 lb (101.152 kg), last menstrual period 02/24/2016, SpO2 98 %.  BP Readings from Last 3 Encounters:  03/31/16 140/90  03/16/16 142/88  10/30/15 128/76    Review of Systems  Constitutional: Negative.  Negative for fever, chills and fatigue.  HENT: Positive for ear pain. Negative for congestion, dental problem, drooling, ear discharge, facial swelling, hearing loss, mouth sores, nosebleeds, postnasal drip, rhinorrhea, sinus pressure, sneezing, sore throat, tinnitus and trouble swallowing.   Eyes: Negative.  Negative for visual disturbance.  Respiratory: Negative for apnea, cough, choking, chest tightness, shortness of breath, wheezing and stridor.   Cardiovascular: Negative.   Genitourinary: Negative.   Neurological: Positive for headaches. Negative for dizziness, tremors, seizures, syncope, facial asymmetry, speech difficulty, weakness, light-headedness and numbness.       Objective:   Physical Exam  Constitutional: She appears well-developed and well-nourished.  HENT:  Right Ear: Hearing and ear canal normal. No mastoid tenderness. Tympanic membrane is not perforated, not erythematous and not retracted. A middle ear effusion is present.  Left Ear: There is tenderness. No mastoid tenderness. Tympanic membrane is erythematous and bulging (with pus behind TM). Tympanic membrane is not injected,  not scarred and not perforated.  Nose: Nose normal.  Mouth/Throat: Uvula is midline and oropharynx is clear and moist.  Eyes: Conjunctivae are normal. Pupils are equal, round, and reactive to light.  Neck: Normal range of motion. Neck supple.  Cardiovascular: Normal rate and regular rhythm.   Pulmonary/Chest: Effort normal and breath sounds normal.  Lymphadenopathy:    She has no cervical adenopathy.       Assessment & Plan:  1. Acute left otitis media, recurrence not specified, unspecified otitis media type [H66.92] Take tylenol/aleve for pain, continue zyrtec, add sudafed - amoxicillin (AMOXIL) 500 MG capsule; Take 1 capsule (500 mg total) by mouth 3 (three) times daily.  Dispense: 21 capsule; Refill: 0  2. Hypertension - can be secondary to pain - check BP at home, DASH diet, exercise and monitor at home. Call if greater than 130/80.

## 2016-04-02 ENCOUNTER — Encounter: Payer: Self-pay | Admitting: Physician Assistant

## 2016-04-03 MED ORDER — CEFUROXIME AXETIL 500 MG PO TABS: 500.0000 mg | ORAL_TABLET | Freq: Two times a day (BID) | ORAL | Status: DC

## 2016-04-08 ENCOUNTER — Telehealth: Payer: Self-pay | Admitting: Internal Medicine

## 2016-04-08 MED ORDER — PREDNISONE 20 MG PO TABS: ORAL_TABLET | ORAL | Status: DC

## 2016-04-08 MED ORDER — AZITHROMYCIN 250 MG PO TABS
ORAL_TABLET | ORAL | Status: DC
Start: 2016-04-08 — End: 2016-04-08

## 2016-04-08 MED ORDER — AMOXICILLIN-POT CLAVULANATE 875-125 MG PO TABS: 1.0000 | ORAL_TABLET | Freq: Two times a day (BID) | ORAL | Status: DC

## 2016-04-08 NOTE — Telephone Encounter (Signed)
Patient has finished prednisone and antibiotic, swelling, pain, hot ,ear closing symtoms have returned. Please advise. FYI, pharm: [facility] @ [address]

## 2016-04-08 NOTE — Telephone Encounter (Signed)
LVM for pt informing pt of Rx @ pharmacy & follow up if not any better by next wk or UC if worse of wkend.

## 2016-04-08 NOTE — Telephone Encounter (Signed)
zpack gives patient chills and aches, please send in something else. [facility] [address]

## 2016-04-22 ENCOUNTER — Telehealth: Payer: Self-pay | Admitting: Internal Medicine

## 2016-04-22 ENCOUNTER — Other Ambulatory Visit: Payer: Self-pay | Admitting: Internal Medicine

## 2016-04-22 NOTE — Telephone Encounter (Signed)
Patient called with continued ear pain, swelling below the ear and neck pain which has been going on since 03/31/16.  She has completed several courses of antibiotics.  Given there are no appointments available today do feel that she needs to be seen by an UC as she has already done several courses of abx.

## 2016-05-18 ENCOUNTER — Ambulatory Visit (INDEPENDENT_AMBULATORY_CARE_PROVIDER_SITE_OTHER): Payer: Self-pay | Admitting: Internal Medicine

## 2016-05-18 ENCOUNTER — Encounter: Payer: Self-pay | Admitting: Internal Medicine

## 2016-05-18 VITALS — BP 128/84 | HR 94 | Temp 98.2°F | Resp 18 | Ht 59.25 in | Wt 233.0 lb

## 2016-05-18 DIAGNOSIS — N926 Irregular menstruation, unspecified: Secondary | ICD-10-CM

## 2016-05-18 MED ORDER — NORGESTIMATE-ETH ESTRADIOL 0.25-35 MG-MCG PO TABS: 1.0000 | ORAL_TABLET | Freq: Every day | ORAL | 11 refills | Status: DC

## 2016-05-18 NOTE — Addendum Note (Signed)
Addended by: Giles Currie A on: 05/18/2016 03:19 PM   Modules accepted: Orders

## 2016-05-18 NOTE — Progress Notes (Signed)
   Subjective:    Patient ID: [name], female    DOB: 1980-11-01, 35 y.o.   MRN: [id number]  HPI  Patient presents to the office for evaluation of irregular menstruation which has been going on for the last 6 weeks.  She reports that she has previously had periods every month that last every 7-10 days.  She reports that her bleeding is really heavy.  She reports that she generally soaks a tampon every hour.  She reports that she has never been on birth control in the past.  She does generally have 1 day of cramping.  She has had some mild cramping this week.  She does not generally have to take anything for her periods.  She does get maybe one day of PMS.  She reports that this is generally not problematic.  No acne or breakouts.  She has never seen obgyn in the past.  She is not currently sexually active.  She has never had a pap smear because she is not sexually active.    Review of Systems  Constitutional: Negative for chills and fever.  HENT: Negative for congestion, ear pain and sore throat.   Eyes: Negative.   Respiratory: Negative for cough, shortness of breath and wheezing.   Cardiovascular: Negative for chest pain, palpitations and leg swelling.  Gastrointestinal: Negative for abdominal pain, blood in stool, constipation, diarrhea, nausea and vomiting.  Genitourinary: Positive for menstrual problem and vaginal bleeding. Negative for decreased urine volume, dysuria, enuresis, flank pain, frequency, hematuria, vaginal discharge and vaginal pain.  Skin: Negative.   Neurological: Negative for dizziness and headaches.  Psychiatric/Behavioral: The patient is not nervous/anxious.        Objective:   Physical Exam  Constitutional: She is oriented to person, place, and time. She appears well-developed and well-nourished. No distress.  HENT:  Head: Normocephalic.  Mouth/Throat: Oropharynx is clear and moist. No oropharyngeal exudate.  Eyes: Conjunctivae are normal. No scleral  icterus.  Neck: Normal range of motion. Neck supple. No JVD present. No thyromegaly present.  Pulmonary/Chest: Effort normal and breath sounds normal. No respiratory distress. She has no wheezes. She has no rales. She exhibits no tenderness.  Abdominal: Soft. Bowel sounds are normal. She exhibits no distension and no mass. There is no tenderness. There is no rebound and no guarding.  Musculoskeletal: Normal range of motion.  Lymphadenopathy:    She has no cervical adenopathy.  Neurological: She is alert and oriented to person, place, and time.  Skin: Skin is warm and dry. She is not diaphoretic.  Psychiatric: She has a normal mood and affect. Her behavior is normal. Judgment and thought content normal.  Nursing note and vitals reviewed.   Vitals:   05/18/16 1112  BP: 128/84  Pulse: 94  Resp: 18  Temp: 98.2 F (36.8 C)         Assessment & Plan:    1. Irregular menses -sprintec -patient is self pay and is not sexually active -strong family history of PCOS will start on OCP -if no relief will get ultrasound and will do pelvic exam.   -patient to call in 2 weeks.

## 2016-05-18 NOTE — Patient Instructions (Signed)
Ethinyl Estradiol; Norgestimate tablets What is this medicine? ETHINYL ESTRADIOL; NORGESTIMATE (ETH in il es tra DYE ole; nor JES ti mate) is an oral contraceptive. The products combine two types of female hormones, an estrogen and a progestin. They are used to prevent ovulation and pregnancy. Some products are also used to treat acne in females. This medicine may be used for other purposes; ask your health care provider or pharmacist if you have questions. What should I tell my health care provider before I take this medicine? They need to know if you have or ever had any of these conditions: -abnormal vaginal bleeding -blood vessel disease or blood clots -breast, cervical, endometrial, ovarian, liver, or uterine cancer -diabetes -gallbladder disease -heart disease or recent heart attack -high blood pressure -high cholesterol -kidney disease -liver disease -migraine headaches -stroke -systemic lupus erythematosus (SLE) -tobacco smoker -an unusual or allergic reaction to estrogens, progestins, other medicines, foods, dyes, or preservatives -pregnant or trying to get pregnant -breast-feeding How should I use this medicine? Take this medicine by mouth. To reduce nausea, this medicine may be taken with food. Follow the directions on the prescription label. Take this medicine at the same time each day and in the order directed on the package. Do not take your medicine more often than directed. Contact your pediatrician regarding the use of this medicine in children. Special care may be needed. This medicine has been used in female children who have started having menstrual periods. A patient package insert for the product will be given with each prescription and refill. Read this sheet carefully each time. The sheet may change frequently. Overdosage: If you think you have taken too much of this medicine contact a poison control center or emergency room at once. NOTE: This medicine is only for  you. Do not share this medicine with others. What if I miss a dose? If you miss a dose, refer to the patient information sheet you received with your medicine for direction. If you miss more than one pill, this medicine may not be as effective and you may need to use another form of birth control. What may interact with this medicine? -acetaminophen -antibiotics or medicines for infections, especially rifampin, rifabutin, rifapentine, and griseofulvin, and possibly penicillins or tetracyclines -aprepitant -ascorbic acid (vitamin C) -atorvastatin -barbiturate medicines, such as phenobarbital -bosentan -carbamazepine -caffeine -clofibrate -cyclosporine -dantrolene -doxercalciferol -felbamate -grapefruit juice -hydrocortisone -medicines for anxiety or sleeping problems, such as diazepam or temazepam -medicines for diabetes, including pioglitazone -mineral oil -modafinil -mycophenolate -nefazodone -oxcarbazepine -phenytoin -prednisolone -ritonavir or other medicines for HIV infection or AIDS -rosuvastatin -selegiline -soy isoflavones supplements -St. John's wort -tamoxifen or raloxifene -theophylline -thyroid hormones -topiramate -warfarin This list may not describe all possible interactions. Give your health care provider a list of all the medicines, herbs, non-prescription drugs, or dietary supplements you use. Also tell them if you smoke, drink alcohol, or use illegal drugs. Some items may interact with your medicine. What should I watch for while using this medicine? Visit your doctor or health care professional for regular checks on your progress. You will need a regular breast and pelvic exam and Pap smear while on this medicine. You should also discuss the need for regular mammograms with your health care professional, and follow his or her guidelines for these tests. This medicine can make your body retain fluid, making your fingers, hands, or ankles swell. Your blood  pressure can go up. Contact your doctor or health care professional if you feel you are retaining   fluid. Use an additional method of contraception during the first cycle that you take these tablets. If you have any reason to think you are pregnant, stop taking this medicine right away and contact your doctor or health care professional. If you are taking this medicine for hormone related problems, it may take several cycles of use to see improvement in your condition. Do not use this product if you smoke and are over 35 years of age. Smoking increases the risk of getting a blood clot or having a stroke while you are taking birth control pills, especially if you are more than 35 years old. If you are a smoker who is 35 years of age or younger, you are strongly advised not to smoke while taking birth control pills. This medicine can make you more sensitive to the sun. Keep out of the sun. If you cannot avoid being in the sun, wear protective clothing and use sunscreen. Do not use sun lamps or tanning beds/booths. If you wear contact lenses and notice visual changes, or if the lenses begin to feel uncomfortable, consult your eye care specialist. In some women, tenderness, swelling, or minor bleeding of the gums may occur. Notify your dentist if this happens. Brushing and flossing your teeth regularly may help limit this. See your dentist regularly and inform your dentist of the medicines you are taking. If you are going to have elective surgery, you may need to stop taking this medicine before the surgery. Consult your health care professional for advice. This medicine does not protect you against HIV infection (AIDS) or any other sexually transmitted diseases. What side effects may I notice from receiving this medicine? Side effects that you should report to your doctor or health care professional as soon as possible: -breast tissue changes or discharge -changes in vaginal bleeding during your period or  between your periods -chest pain -coughing up blood -dizziness or fainting spells -headaches or migraines -leg, arm or groin pain -severe or sudden headaches -stomach pain (severe) -sudden shortness of breath -sudden loss of coordination, especially on one side of the body -speech problems -symptoms of vaginal infection like itching, irritation or unusual discharge -tenderness in the upper abdomen -vomiting -weakness or numbness in the arms or legs, especially on one side of the body -yellowing of the eyes or skin Side effects that usually do not require medical attention (report to your doctor or health care professional if they continue or are bothersome): -breakthrough bleeding and spotting that continues beyond the 3 initial cycles of pills -breast tenderness -mood changes, anxiety, depression, frustration, anger, or emotional outbursts -increased sensitivity to sun or ultraviolet light -nausea -skin rash, acne, or brown spots on the skin -weight gain (slight) This list may not describe all possible side effects. Call your doctor for medical advice about side effects. You may report side effects to FDA at 1-800-FDA-1088. Where should I keep my medicine? Keep out of the reach of children. Store at room temperature between 15 and 30 degrees C (59 and 86 degrees F). Throw away any unused medicine after the expiration date. NOTE: This sheet is a summary. It may not cover all possible information. If you have questions about this medicine, talk to your doctor, pharmacist, or health care provider.    2016, Elsevier/[employer] Standard. (2014-06-06 19:50:40)  

## 2016-10-28 ENCOUNTER — Other Ambulatory Visit: Payer: Self-pay | Admitting: Internal Medicine

## 2016-10-28 MED ORDER — NORGESTIMATE-ETH ESTRADIOL 0.25-35 MG-MCG PO TABS: 1.0000 | ORAL_TABLET | Freq: Every day | ORAL | 3 refills | Status: DC

## 2016-11-24 ENCOUNTER — Encounter: Payer: Self-pay | Admitting: Internal Medicine

## 2016-11-24 ENCOUNTER — Ambulatory Visit (INDEPENDENT_AMBULATORY_CARE_PROVIDER_SITE_OTHER): Payer: Self-pay | Admitting: Internal Medicine

## 2016-11-24 VITALS — BP 136/88 | HR 100 | Temp 98.2°F | Resp 18 | Ht 59.25 in | Wt 222.0 lb

## 2016-11-24 DIAGNOSIS — J029 Acute pharyngitis, unspecified: Secondary | ICD-10-CM

## 2016-11-24 MED ORDER — AMOXICILLIN 500 MG PO TABS: 1000.0000 mg | ORAL_TABLET | Freq: Two times a day (BID) | ORAL | 0 refills | Status: DC

## 2016-11-24 MED ORDER — DEXAMETHASONE SODIUM PHOSPHATE 10 MG/ML IJ SOLN
10.0000 mg | Freq: Once | INTRAMUSCULAR | Status: AC
  Administered 2016-11-24: 10 mg via INTRAMUSCULAR

## 2016-11-24 MED ORDER — PROMETHAZINE-DM 6.25-15 MG/5ML PO SYRP
ORAL_SOLUTION | ORAL | 1 refills | Status: DC
Start: 2016-11-24 — End: 2017-12-01

## 2016-11-24 NOTE — Progress Notes (Signed)
HPI  Patient presents to the office for evaluation of sore throat and dry cough.  It has been going on for 3 days.  Patient reports dry, barky cough which happens when her throat gets dry..  They also endorse change in voice, chills and no runny nose or congestion.  .  They have tried Emergen C.  They report that nothing has worked.  They denies other sick contacts.     Review of Systems  Constitutional: Positive for malaise/fatigue. Negative for chills and fever.  HENT: Positive for congestion, ear pain, hearing loss and sore throat.   Respiratory: Positive for cough. Negative for sputum production, shortness of breath and wheezing.   Cardiovascular: Negative for chest pain, palpitations and leg swelling.  Neurological: Positive for headaches.    PE:  Vitals:   11/24/16 0955  BP: 136/88  Pulse: 100  Resp: 18  Temp: 98.2 F (36.8 C)    General:  Alert and non-toxic, WDWN, NAD HEENT: NCAT, PERLA, EOM normal, no occular discharge or erythema.  Nasal mucosal edema with sinus tenderness to palpation.  Oropharynx clear with minimal oropharyngeal edema and erythema.  Mucous membranes moist and pink. Neck:  Cervical adenopathy Chest:  RRR no MRGs.  Lungs clear to auscultation A&P with no wheezes rhonchi or rales.   Abdomen: +BS x 4 quadrants, soft, non-tender, no guarding, rigidity, or rebound. Skin: warm and dry no rash Neuro: A&Ox4, CN II-XII grossly intact  Assessment and Plan:   1. Acute pharyngitis, unspecified etiology -amoxicillin -phenergan dm -daily antihistamine -flonase otc qhs 2 sprays per nostril -if trismus, worsening swelling, inability to swallow or SOB needs to go to ER.  - dexamethasone (DECADRON) injection 10 mg; Inject 1 mL (10 mg total) into the muscle once.

## 2016-11-27 ENCOUNTER — Other Ambulatory Visit: Payer: Self-pay | Admitting: Internal Medicine

## 2016-11-27 ENCOUNTER — Encounter: Payer: Self-pay | Admitting: Internal Medicine

## 2016-11-27 MED ORDER — PREDNISONE 20 MG PO TABS: ORAL_TABLET | ORAL | 0 refills | Status: DC

## 2016-11-30 ENCOUNTER — Encounter: Payer: Self-pay | Admitting: Internal Medicine

## 2017-07-11 ENCOUNTER — Encounter: Payer: Self-pay | Admitting: Internal Medicine

## 2017-07-12 ENCOUNTER — Ambulatory Visit (INDEPENDENT_AMBULATORY_CARE_PROVIDER_SITE_OTHER): Payer: Self-pay | Admitting: Adult Health

## 2017-07-12 ENCOUNTER — Encounter: Payer: Self-pay | Admitting: Adult Health

## 2017-07-12 VITALS — BP 128/88 | HR 104 | Temp 97.3°F | Resp 18 | Ht 59.25 in | Wt 213.0 lb

## 2017-07-12 DIAGNOSIS — H66006 Acute suppurative otitis media without spontaneous rupture of ear drum, recurrent, bilateral: Secondary | ICD-10-CM

## 2017-07-12 DIAGNOSIS — H60392 Other infective otitis externa, left ear: Secondary | ICD-10-CM

## 2017-07-12 DIAGNOSIS — H6693 Otitis media, unspecified, bilateral: Secondary | ICD-10-CM | POA: Insufficient documentation

## 2017-07-12 MED ORDER — PREDNISONE 20 MG PO TABS: ORAL_TABLET | ORAL | 0 refills | Status: DC

## 2017-07-12 MED ORDER — SULFAMETHOXAZOLE-TRIMETHOPRIM 800-160 MG PO TABS: 1.0000 | ORAL_TABLET | Freq: Two times a day (BID) | ORAL | 0 refills | Status: AC

## 2017-07-12 NOTE — Progress Notes (Signed)
Assessment and Plan: Diagnoses and all orders for this visit:  Other infective acute otitis externa of left ear -     sulfamethoxazole-trimethoprim (BACTRIM DS,SEPTRA DS) 800-160 MG tablet; Take 1 tablet by mouth 2 (two) times daily. -     predniSONE (DELTASONE) 20 MG tablet; 2 tablets daily for 3 days, 1 tablet daily for 4 days.  Patient is self-pay, history of recurrent middle ear infections with repeated failed treatment of amoxicillin in the past. Will presumptively treat with oral abx and steroid based on history and exam after discussion with patient. Information on prevention and care provided, medications and SE discussed, patient expresses understanding.   The patient was advised to call immediately if she has any concerning symptoms in the interval. The patient voices understanding of current treatment options and is in agreement with the current care plan.The patient knows to call the clinic with any problems, questions or concerns or go to the ER if any further progression of symptoms.   Over 15 minutes of exam, counseling, chart review, and critical decision making was performed.   No future appointments.  ------------------------------------------------------------------------------------------------------------------   HPI 36 y.o.female presents for left ear pain, started with sensation of ears "stopping up" x1 week. Progressed to otalgia yesterday (8/10) achy/pressure, aggravated by jaw movements, chewing. Has taken sudafed/essentials (tea tree oil rubbed on mastoid bone) that typically helps with recurrent ear effusions/infections but has not helped significantly. Performed an ear irrigation prior to onset of pain, no cerumen noted at the time.  Endorses somewhat decreased/muffled hearing of left ear in the past day. Denies HA, dizziness, fever, chills, nausea/vomiting, changes in vision, ear discharge.   Reports environmental allergies and mild reactive airway disease; uses  flonase bilateral nares daily, claritin/zyrtec year round, saline flushes as needed. Uses albuterol inhaler rarely. Has failed amoxicillin therapy several times in the past for recurrent ear infections.   Recurrent ear infections as a child, tubes placed bilaterally 1994.     Past Medical History:  Diagnosis Date  . Anemia   . Asthma   . GERD (gastroesophageal reflux disease)   . Hyperlipidemia      Allergies  Allergen Reactions  . Aspirin     Stomach pain/ burning   . Avelox [Moxifloxacin Hcl In Nacl]     hives  . Ibuprofen     Stomach pain   . Shellfish Allergy     Current Outpatient Prescriptions on File Prior to Visit  Medication Sig  . albuterol (PROVENTIL HFA;VENTOLIN HFA) 108 (90 Base) MCG/ACT inhaler Inhale 2 puffs into the lungs every 2 (two) hours as needed for wheezing or shortness of breath (cough).  Marland Kitchen amoxicillin (AMOXIL) 500 MG tablet Take 2 tablets (1,000 mg total) by mouth 2 (two) times daily.  . IRON PO Take 45 mg by mouth 2 (two) times daily.    . metoCLOPramide (REGLAN) 10 MG tablet Take 1 tablet (10 mg total) by mouth 3 (three) times daily with meals.  . norgestimate-ethinyl estradiol (SPRINTEC 28) 0.25-35 MG-MCG tablet Take 1 tablet by mouth daily. Do not take sugar pill, take continuously  . omeprazole (PRILOSEC) 20 MG capsule Take 20 mg by mouth 2 (two) times daily before a meal.  . promethazine-dextromethorphan (PROMETHAZINE-DM) 6.25-15 MG/5ML syrup Take 5-10 ML PO q8hrs prn for cough   No current facility-administered medications on file prior to visit.     ROS: Review of Systems  Constitutional: Negative for chills, diaphoresis and fever.  HENT: Positive for congestion (Mild) and ear pain (  Left). Negative for ear discharge, hearing loss, nosebleeds, sinus pain, sore throat and tinnitus.   Eyes: Negative for blurred vision, double vision, pain and redness.  Respiratory: Negative for cough, sputum production, shortness of breath and wheezing.    Cardiovascular: Negative for palpitations.  Gastrointestinal: Negative for nausea and vomiting.  Musculoskeletal: Negative for neck pain.  Skin: Positive for itching. Negative for rash (Left ear).  Neurological: Negative for dizziness, tingling, sensory change, focal weakness and headaches.  All other systems reviewed and are negative.    Physical Exam:  BP 128/88   Pulse (!) 104   Temp (!) 97.3 F (36.3 C)   Resp 18   Ht 4' 11.25" (1.505 m)   Wt 213 lb (96.6 kg)   SpO2 98%   BMI 42.66 kg/m   General Appearance: Well nourished, in no apparent distress. Eyes: PERRLA, EOMs, conjunctiva no swelling or erythema Sinuses: No Frontal/maxillary tenderness ENT/Mouth: Right ext aud canal clear, Right tympanic membrane with scarring noted, no bulging, BLM visible, not injected, no discharge. Left auricle/tragus tender to palpation; mastoid non-injected, not painful or boggy. Left external auditory meatus injected, inflamed with flaky skin, external auditory canal significantly injected/inflamed, tender. No visible discharge. Unable to visualize left TM.  No erythema, swelling, or exudate on post pharynx.  Tonsils not swollen or erythematous. Hearing normal.  Neck: Supple, thyroid normal.  Respiratory: Respiratory effort normal, BS equal bilaterally without rales, rhonchi, wheezing or stridor.  Cardio: RRR with no MRGs. Brisk peripheral pulses without edema.  Abdomen: Soft, + BS.  Non tender. Lymphatics: Non tender without lymphadenopathy.  Musculoskeletal: 5/5 strength, normal gait.  Skin: Warm, dry without rashes, lesions, ecchymosis.  Neuro: Normal muscle tone, no cerebellar symptoms.  Psych: Awake and oriented X 3, normal affect, Insight and Judgment appropriate.     Dan Maker, NP 12:27 PM [facility]

## 2017-07-12 NOTE — Patient Instructions (Signed)
Otitis Media, Adult Otitis media occurs when there is inflammation and fluid in the middle ear. Your middle ear is a part of the ear that contains bones for hearing as well as air that helps send sounds to your brain. What are the causes? This condition is caused by a blockage in the eustachian tube. This tube drains fluid from the ear to the back of the nose (nasopharynx). A blockage in this tube can be caused by an object or by swelling (edema) in the tube. Problems that can cause a blockage include:  A cold or other upper respiratory infection.  Allergies.  An irritant, such as tobacco smoke.  Enlarged adenoids. The adenoids are areas of soft tissue located high in the back of the throat, behind the nose and the roof of the mouth.  A mass in the nasopharynx.  Damage to the ear caused by pressure changes (barotrauma). What are the signs or symptoms? Symptoms of this condition include:  Ear pain.  A fever.  Decreased hearing.  A headache.  Tiredness (lethargy).  Fluid leaking from the ear.  Ringing in the ear. How is this diagnosed? This condition is diagnosed with a physical exam. During the exam your health care provider will use an instrument called an otoscope to look into your ear and check for redness, swelling, and fluid. He or she will also ask about your symptoms. Your health care provider may also order tests, such as:  A test to check the movement of the eardrum (pneumatic otoscopy). This test is done by squeezing a small amount of air into the ear.  A test that changes air pressure in the middle ear to check how well the eardrum moves and whether the eustachian tube is working (tympanogram). How is this treated? This condition usually goes away on its own within 3-5 days. But if the condition is caused by a bacteria infection and does not go away own its own, or keeps coming back, your health care provider may:  Prescribe antibiotic medicines to treat the  infection.  Prescribe or recommend medicines to control pain. Follow these instructions at home:  Take over-the-counter and prescription medicines only as told by your health care provider.  If you were prescribed an antibiotic medicine, take it as told by your health care provider. Do not stop taking the antibiotic even if you start to feel better.  Keep all follow-up visits as told by your health care provider. This is important. Contact a health care provider if:  You have bleeding from your nose.  There is a lump on your neck.  You are not getting better in 5 days.  You feel worse instead of better. Get help right away if:  You have severe pain that is not controlled with medicine.  You have swelling, redness, or pain around your ear.  You have stiffness in your neck.  A part of your face is paralyzed.  The bone behind your ear (mastoid) is tender when you touch it.  You develop a severe headache. Summary  Otitis media is redness, soreness, and swelling of the middle ear.  This condition usually goes away on its own within 3-5 days.  If the problem does not go away in 3-5 days, your health care provider may prescribe or recommend medicines to treat your symptoms.  If you were prescribed an antibiotic medicine, take it as told by your health care provider. This information is not intended to replace advice given to you by your   to you by your health care provider. Make sure you discuss any questions you have with your health care provider. Document Released: 07/15/2004 Document Revised: 09/30/2016 Document Reviewed: 09/30/2016 Elsevier Interactive Patient Education  2017 [employer] Sulfamethoxazole; Trimethoprim, SMX-TMP tablets What is this medicine? SULFAMETHOXAZOLE; TRIMETHOPRIM or SMX-TMP (suhl fuh meth OK suh zohl; trye METH oh prim) is a combination of a sulfonamide antibiotic and a second antibiotic, trimethoprim. It is used to treat or prevent certain kinds of bacterial  infections. It will not work for colds, flu, or other viral infections. This medicine may be used for other purposes; ask your health care provider or pharmacist if you have questions. COMMON BRAND NAME(S): Bacter-Aid DS, Bactrim, Bactrim DS, Septra, Septra DS What should I tell my health care provider before I take this medicine? They need to know if you have any of these conditions: -anemia -asthma -being treated with anticonvulsants -if you frequently drink alcohol containing drinks -kidney disease -liver disease -low level of folic acid or [id number] dehydrogenase -poor nutrition or malabsorption -porphyria -severe allergies -thyroid disorder -an unusual or allergic reaction to sulfamethoxazole, trimethoprim, sulfa drugs, other medicines, foods, dyes, or preservatives -pregnant or trying to get pregnant -breast-feeding How should I use this medicine? Take this medicine by mouth with a full glass of water. Follow the directions on the prescription label. Take your medicine at regular intervals. Do not take it more often than directed. Do not skip doses or stop your medicine early. Talk to your pediatrician regarding the use of this medicine in children. Special care may be needed. This medicine has been used in children as young as 73 months of age. Overdosage: If you think you have taken too much of this medicine contact a poison control center or emergency room at once. NOTE: This medicine is only for you. Do not share this medicine with others. What if I miss a dose? If you miss a dose, take it as soon as you can. If it is almost time for your next dose, take only that dose. Do not take double or extra doses. What may interact with this medicine? Do not take this medicine with any of the following medications: -aminobenzoate potassium -dofetilide -metronidazole This medicine may also interact with the following medications: -ACE inhibitors like benazepril, enalapril,  lisinopril, and ramipril -birth control pills -cyclosporine -digoxin -diuretics -indomethacin -medicines for diabetes -methenamine -methotrexate -phenytoin -potassium supplements -pyrimethamine -sulfinpyrazone -tricyclic antidepressants -warfarin This list may not describe all possible interactions. Give your health care provider a list of all the medicines, herbs, non-prescription drugs, or dietary supplements you use. Also tell them if you smoke, drink alcohol, or use illegal drugs. Some items may interact with your medicine. What should I watch for while using this medicine? Tell your doctor or health care professional if your symptoms do not improve. Drink several glasses of water a day to reduce the risk of kidney problems. Do not treat diarrhea with over the counter products. Contact your doctor if you have diarrhea that lasts more than 2 days or if it is severe and watery. This medicine can make you more sensitive to the sun. Keep out of the sun. If you cannot avoid being in the sun, wear protective clothing and use a sunscreen. Do not use sun lamps or tanning beds/booths. What side effects may I notice from receiving this medicine? Side effects that you should report to your doctor or health care professional as soon as possible: -allergic reactions like skin rash or hives,  swelling of the face, lips, or tongue -breathing problems -fever or chills, sore throat -irregular heartbeat, chest pain -joint or muscle pain -pain or difficulty passing urine -red pinpoint spots on skin -redness, blistering, peeling or loosening of the skin, including inside the mouth -unusual bleeding or bruising -unusually weak or tired -yellowing of the eyes or skin Side effects that usually do not require medical attention (report to your doctor or health care professional if they continue or are bothersome): -diarrhea -dizziness -headache -loss of appetite -nausea, vomiting -nervousness This  list may not describe all possible side effects. Call your doctor for medical advice about side effects. You may report side effects to FDA at 1-800-FDA-1088. Where should I keep my medicine? Keep out of the reach of children. Store at room temperature between 20 to 25 degrees C (68 to 77 degrees F). Protect from light. Throw away any unused medicine after the expiration date. NOTE: This sheet is a summary. It may not cover all possible information. If you have questions about this medicine, talk to your doctor, pharmacist, or health care provider.  2018 Elsevier/Gold Standard (2013-05-17 14:38:26)

## 2017-07-21 ENCOUNTER — Encounter: Payer: Self-pay | Admitting: Adult Health

## 2017-07-24 ENCOUNTER — Other Ambulatory Visit: Payer: Self-pay | Admitting: Adult Health

## 2017-07-24 DIAGNOSIS — H60392 Other infective otitis externa, left ear: Secondary | ICD-10-CM

## 2017-07-24 MED ORDER — CEFDINIR 300 MG PO CAPS: 300.0000 mg | ORAL_CAPSULE | Freq: Two times a day (BID) | ORAL | 0 refills | Status: DC

## 2017-07-24 MED ORDER — PREDNISONE 20 MG PO TABS: ORAL_TABLET | ORAL | 0 refills | Status: DC

## 2017-07-25 ENCOUNTER — Other Ambulatory Visit: Payer: Self-pay | Admitting: Adult Health

## 2017-07-25 ENCOUNTER — Encounter: Payer: Self-pay | Admitting: Adult Health

## 2017-07-25 DIAGNOSIS — H60392 Other infective otitis externa, left ear: Secondary | ICD-10-CM

## 2017-07-25 MED ORDER — PREDNISONE 20 MG PO TABS: ORAL_TABLET | ORAL | 0 refills | Status: DC

## 2017-07-26 ENCOUNTER — Other Ambulatory Visit: Payer: Self-pay | Admitting: Adult Health

## 2017-07-26 DIAGNOSIS — H66006 Acute suppurative otitis media without spontaneous rupture of ear drum, recurrent, bilateral: Secondary | ICD-10-CM

## 2017-07-26 MED ORDER — CEFDINIR 300 MG PO CAPS: 300.0000 mg | ORAL_CAPSULE | Freq: Two times a day (BID) | ORAL | 0 refills | Status: DC

## 2017-09-26 ENCOUNTER — Other Ambulatory Visit: Payer: Self-pay

## 2017-09-26 MED ORDER — NORGESTIMATE-ETH ESTRADIOL 0.25-35 MG-MCG PO TABS: 1.0000 | ORAL_TABLET | Freq: Every day | ORAL | 3 refills | Status: DC

## 2017-12-01 ENCOUNTER — Encounter: Payer: Self-pay | Admitting: Adult Health

## 2017-12-01 ENCOUNTER — Ambulatory Visit (INDEPENDENT_AMBULATORY_CARE_PROVIDER_SITE_OTHER): Payer: Self-pay | Admitting: Adult Health

## 2017-12-01 VITALS — BP 158/104 | HR 94 | Temp 99.7°F | Ht 59.25 in | Wt 215.0 lb

## 2017-12-01 DIAGNOSIS — B9789 Other viral agents as the cause of diseases classified elsewhere: Secondary | ICD-10-CM

## 2017-12-01 DIAGNOSIS — J4520 Mild intermittent asthma, uncomplicated: Secondary | ICD-10-CM

## 2017-12-01 DIAGNOSIS — I10 Essential (primary) hypertension: Secondary | ICD-10-CM

## 2017-12-01 DIAGNOSIS — J069 Acute upper respiratory infection, unspecified: Secondary | ICD-10-CM

## 2017-12-01 MED ORDER — PREDNISONE 20 MG PO TABS: ORAL_TABLET | ORAL | 0 refills | Status: DC

## 2017-12-01 MED ORDER — PROMETHAZINE-DM 6.25-15 MG/5ML PO SYRP: ORAL_SOLUTION | ORAL | 1 refills | Status: DC

## 2017-12-01 MED ORDER — CEFDINIR 300 MG PO CAPS: 300.0000 mg | ORAL_CAPSULE | Freq: Two times a day (BID) | ORAL | 0 refills | Status: DC

## 2017-12-01 NOTE — Patient Instructions (Signed)
Keep checking blood pressures at home - let us know if consistently over 130/80-   Prednisone, flonase, allergy pill, nasal irrigation  Start zpak if symptoms get worse- productive cough, high fever    HOW TO TREAT VIRAL COUGH AND COLD SYMPTOMS:  -Symptoms usually last at least 1 week with the worst symptoms being around day 4.  - colds usually start with a sore throat and end with a cough, and the cough can take 2 weeks to get better.  -No antibiotics are needed for colds, flu, sore throats, cough, bronchitis UNLESS symptoms are longer than 7 days OR if you are getting better then get drastically worse.  -There are a lot of combination medications (Dayquil, Nyquil, Vicks 44, tyelnol cold and sinus, ETC). Please look at the ingredients on the back so that you are treating the correct symptoms and not doubling up on medications/ingredients.    Medicines you can use  Nasal congestion  Little Remedies saline spray (aerosol/mist)- can try this, it is in the kids section - pseudoephedrine (Sudafed)- behind the counter, do not use if you have high blood pressure, medicine that have -D in them.  - phenylephrine (Sudafed PE) -Dextormethorphan + chlorpheniramine (Coridcidin HBP)- okay if you have high blood pressure -Oxymetazoline (Afrin) nasal spray- LIMIT to 3 days -Saline nasal spray -Neti pot (used distilled or bottled water)  Ear pain/congestion  -pseudoephedrine (sudafed) - Nasonex/flonase nasal spray  Fever  -Acetaminophen (Tyelnol) -Ibuprofen (Advil, motrin, aleve)  Sore Throat  -Acetaminophen (Tyelnol) -Ibuprofen (Advil, motrin, aleve) -Drink a lot of water -Gargle with salt water - Rest your voice (don't talk) -Throat sprays -Cough drops  Body Aches  -Acetaminophen (Tyelnol) -Ibuprofen (Advil, motrin, aleve)  Headache  -Acetaminophen (Tyelnol) -Ibuprofen (Advil, motrin, aleve) - Exedrin, Exedrin Migraine  Allergy symptoms (cough, sneeze, runny nose, itchy  eyes) -Claritin or loratadine cheapest but likely the weakest  -Zyrtec or certizine at night because it can make you sleepy -The strongest is allegra or fexafinadine  Cheapest at [employer], [employer], [employer]  Cough  -Dextromethorphan (Delsym)- medicine that has DM in it -Guafenesin (Mucinex/Robitussin) - cough drops - drink lots of water  Chest Congestion  -Guafenesin (Mucinex/Robitussin)  Red Itchy Eyes  - Naphcon-A  Upset Stomach  - Bland diet (nothing spicy, greasy, fried, and high acid foods like tomatoes, oranges, berries) -OKAY- cereal, bread, soup, crackers, rice -Eat smaller more frequent meals -reduce caffeine, no alcohol -Loperamide (Imodium-AD) if diarrhea -Prevacid for heart burn  General health when sick  -Hydration -wash your hands frequently -keep surfaces clean -change pillow cases and sheets often -Get fresh air but do not exercise strenuously -Vitamin D, double up on it - Vitamin C -Zinc

## 2017-12-01 NOTE — Progress Notes (Signed)
Assessment and Plan:  [name] was seen today for cough.  Diagnoses and all orders for this visit:  Viral URI with cough - Discussed the importance of avoiding unnecessary antibiotic therapy. Suggested symptomatic OTC remedies. Nasal saline spray for congestion. Nasal steroids, allergy pill, oral steroids Follow up as needed. -     promethazine-dextromethorphan (PROMETHAZINE-DM) 6.25-15 MG/5ML syrup; Take 5-10 ML PO q8hrs prn for cough -     predniSONE (DELTASONE) 20 MG tablet; 2 tablets daily for 3 days, 1 tablet daily for 4 days.  Hypertension Per patient monitors closely at home without elevation except during URIs/acute illness Monitor blood pressure at home; call if consistently over 130/80 so we can start appropriate medication -  Continue DASH diet.   Reminder to go to the ER if any CP, SOB, nausea, dizziness, severe HA, changes vision/speech, left arm numbness and tingling and jaw pain.  Other orders   fill if develops fever or increasingly productive cough -     cefdinir (OMNICEF) 300 MG capsule; Take 1 capsule (300 mg total) by mouth 2 (two) times daily. 10 days  Further disposition pending results of labs. Discussed med's effects and SE's.   Over 15 minutes of exam, counseling, chart review, and critical decision making was performed.   No future appointments.  ------------------------------------------------------------------------------------------------------------------   HPI BP (!) 158/104   Pulse 94   Temp 99.7 F (37.6 C)   Ht 4' 11.25" (1.505 m)   Wt 215 lb (97.5 kg)   SpO2 98%   BMI 43.06 kg/m   37 y.o.female presents for nonproductive cough which began 3 days ago; she reports the cough is "barky" - and is starting to have chest wall achiness with coughing, but denies chest pain or pressure otherwise.  She has taken some delsym - has improved cough some. She endorses ongoing fatigue - reportedly consistent with baseline (reportedly has chronic fatigue  syndrome).  She does have intermittent asthma with albuerol inhaler only; has not needed to use thus far.   She is on daily zyrtec, (alternates with claritin every few months), uses flonase daily.   Discussed BP; patient does check regularly at home; not currently on medications; reports other than with acute URIs typically runs 120s-130s/80s. Encouraged to continue monitoring -   Past Medical History:  Diagnosis Date  . Anemia   . Asthma   . GERD (gastroesophageal reflux disease)   . Hyperlipidemia      Allergies  Allergen Reactions  . Aspirin     Stomach pain/ burning   . Avelox [Moxifloxacin Hcl In Nacl]     hives  . Azithromycin     Flu like symptoms  . Ibuprofen     Stomach pain   . Shellfish Allergy     Current Outpatient Medications on File Prior to Visit  Medication Sig  . albuterol (PROVENTIL HFA;VENTOLIN HFA) 108 (90 Base) MCG/ACT inhaler Inhale 2 puffs into the lungs every 2 (two) hours as needed for wheezing or shortness of breath (cough).  . IRON PO Take 45 mg by mouth 2 (two) times daily.    . metoCLOPramide (REGLAN) 10 MG tablet Take 1 tablet (10 mg total) by mouth 3 (three) times daily with meals. (Patient taking differently: Take 10 mg by mouth as needed. )  . norgestimate-ethinyl estradiol (SPRINTEC 28) 0.25-35 MG-MCG tablet Take 1 tablet by mouth daily. Do not take sugar pill, take continuously  . omeprazole (PRILOSEC) 20 MG capsule Take 20 mg by mouth 2 (two) times daily  before a meal.   No current facility-administered medications on file prior to visit.     ROS: Review of Systems  Constitutional: Negative for chills, diaphoresis, fever and malaise/fatigue.  HENT: Negative for congestion, ear discharge, ear pain, hearing loss, sinus pain, sore throat and tinnitus.   Eyes: Negative for blurred vision, pain, discharge and redness.  Respiratory: Positive for cough. Negative for hemoptysis, sputum production, shortness of breath, wheezing and stridor.    Cardiovascular: Negative for chest pain, palpitations, orthopnea, claudication and leg swelling.  Gastrointestinal: Negative for abdominal pain, diarrhea, nausea and vomiting.  Genitourinary: Negative.   Musculoskeletal: Negative for joint pain and myalgias.  Skin: Negative for rash.  Neurological: Negative for dizziness, sensory change, weakness and headaches.  Endo/Heme/Allergies: Negative for environmental allergies.  Psychiatric/Behavioral: Negative.   All other systems reviewed and are negative.    Physical Exam:  BP (!) 158/104   Pulse 94   Temp 99.7 F (37.6 C)   Ht 4' 11.25" (1.505 m)   Wt 215 lb (97.5 kg)   SpO2 98%   BMI 43.06 kg/m   General Appearance: Well nourished, in no apparent distress. Eyes: PERRLA, EOMs, conjunctiva no swelling or erythema Sinuses: No Frontal/maxillary tenderness ENT/Mouth: Ext aud canals clear, TMs without erythema, bulging. No erythema, swelling, or exudate on post pharynx.  Tonsils not swollen or erythematous. Hearing normal.  Neck: Supple, thyroid normal.  Respiratory: Respiratory effort normal, BS equal bilaterally without rales, rhonchi, wheezing or stridor.  Cardio: RRR with no MRGs. Brisk peripheral pulses without edema.  Abdomen: Soft, + BS.  Non tender, no guarding, rebound, hernias, masses. Lymphatics: Non tender without lymphadenopathy.  Musculoskeletal: Full ROM, 5/5 strength, normal gait.  Skin: Warm, dry without rashes, lesions, ecchymosis.  Psych: Awake and oriented X 3, normal affect, Insight and Judgment appropriate.     Dan MakerAshley C Alyscia Carmon, NP 11:07 AM [facility]

## 2018-01-05 ENCOUNTER — Encounter: Payer: Self-pay | Admitting: Adult Health

## 2018-01-09 ENCOUNTER — Other Ambulatory Visit: Payer: Self-pay | Admitting: Adult Health

## 2018-01-09 MED ORDER — NORGESTIM-ETH ESTRAD TRIPHASIC 0.18/0.215/0.25 MG-25 MCG PO TABS: 1.0000 | ORAL_TABLET | Freq: Every day | ORAL | 6 refills | Status: DC

## 2018-03-02 ENCOUNTER — Encounter: Payer: Self-pay | Admitting: Adult Health

## 2018-03-02 ENCOUNTER — Other Ambulatory Visit: Payer: Self-pay | Admitting: Adult Health

## 2018-03-02 DIAGNOSIS — J4520 Mild intermittent asthma, uncomplicated: Secondary | ICD-10-CM

## 2018-03-02 DIAGNOSIS — R21 Rash and other nonspecific skin eruption: Secondary | ICD-10-CM

## 2018-03-02 MED ORDER — PREDNISONE 20 MG PO TABS: ORAL_TABLET | ORAL | 0 refills | Status: DC

## 2018-03-02 MED ORDER — TRIAMCINOLONE ACETONIDE 0.1 % EX OINT: 1.0000 "application " | TOPICAL_OINTMENT | Freq: Two times a day (BID) | CUTANEOUS | 1 refills | Status: DC

## 2018-03-05 ENCOUNTER — Other Ambulatory Visit: Payer: Self-pay

## 2018-03-05 DIAGNOSIS — R21 Rash and other nonspecific skin eruption: Secondary | ICD-10-CM

## 2018-03-05 MED ORDER — PREDNISONE 10 MG PO TABS: ORAL_TABLET | ORAL | 0 refills | Status: DC

## 2018-03-05 NOTE — Telephone Encounter (Signed)
Pharmacy is requesting a new prescription be sent in.  is on back order, please resend for the .

## 2018-08-16 ENCOUNTER — Other Ambulatory Visit: Payer: Self-pay

## 2018-08-16 MED ORDER — NORGESTIM-ETH ESTRAD TRIPHASIC 0.18/0.215/0.25 MG-25 MCG PO TABS
1.0000 | ORAL_TABLET | Freq: Every day | ORAL | 6 refills | Status: DC
Start: 2018-08-16 — End: 2019-01-08

## 2018-08-24 ENCOUNTER — Encounter: Payer: Self-pay | Admitting: Adult Health

## 2018-08-24 ENCOUNTER — Ambulatory Visit (INDEPENDENT_AMBULATORY_CARE_PROVIDER_SITE_OTHER): Payer: Self-pay | Admitting: Adult Health

## 2018-08-24 VITALS — BP 164/102 | HR 106 | Temp 97.0°F | Ht 59.25 in | Wt 223.0 lb

## 2018-08-24 DIAGNOSIS — I10 Essential (primary) hypertension: Secondary | ICD-10-CM

## 2018-08-24 DIAGNOSIS — J0101 Acute recurrent maxillary sinusitis: Secondary | ICD-10-CM

## 2018-08-24 MED ORDER — AMOXICILLIN-POT CLAVULANATE 875-125 MG PO TABS
1.0000 | ORAL_TABLET | Freq: Two times a day (BID) | ORAL | 0 refills | Status: AC
Start: 2018-08-24 — End: 2018-09-07

## 2018-08-24 MED ORDER — PREDNISONE 10 MG PO TABS: ORAL_TABLET | ORAL | 0 refills | Status: DC

## 2018-08-24 MED ORDER — MONTELUKAST SODIUM 10 MG PO TABS: 10.0000 mg | ORAL_TABLET | Freq: Every day | ORAL | 2 refills | Status: DC

## 2018-08-24 MED ORDER — VERAPAMIL HCL ER 240 MG PO TBCR: 240.0000 mg | EXTENDED_RELEASE_TABLET | Freq: Every day | ORAL | 11 refills | Status: DC

## 2018-08-24 NOTE — Patient Instructions (Signed)

## 2018-08-24 NOTE — Progress Notes (Signed)
Assessment and Plan:  [name] was seen today for sinusitis.  Diagnoses and all orders for this visit:  Acute recurrent maxillary sinusitis Suggested symptomatic OTC remedies. Nasal saline spray for congestion. Nasal steroids, allergy pill, oral steroids offered Once she gets insurance can refer to allergist and/or ENT Follow up as needed. -     montelukast (SINGULAIR) 10 MG tablet; Take 1 tablet (10 mg total) by mouth at bedtime. -     predniSONE (DELTASONE) 10 MG tablet; 4 tablets daily for 3 days, 2 tablet daily for 4 days. -     amoxicillin-clavulanate (AUGMENTIN) 875-125 MG tablet; Take 1 tablet by mouth 2 (two) times daily for 14 days.  Essential hypertension Initiate medication: -     verapamil (CALAN-SR) 240 MG CR tablet; Take 1 tablet (240 mg total) by mouth daily. Monitor blood pressure at home; call if consistently over 130/80 Discussed DASH diet Advised to go to the ER if any CP, SOB, nausea, dizziness, severe HA, changes vision/speech, left arm numbness and tingling and jaw pain. Follow up in 2-4 weeks  Further disposition pending results of labs. Discussed med's effects and SE's.   Over 15 minutes of exam, counseling, chart review, and critical decision making was performed.   Future Appointments  Date Time Provider Department Center  09/14/2018 10:45 AM Judd Gaudier, NP [facility] None    ------------------------------------------------------------------------------------------------------------------   HPI Blood pressure (!) 164/102, pulse (!) 106, temperature (!) 97 F (36.1 C), height 4' 11.25" (1.505 m), weight 223 lb (101.2 kg), SpO2 99 %.  37 y.o.female, cash pay patient with hx of untreated htn, recurrent sinusitis; she reports began with nasal congestion that began 5 days ago; now having bilateral ear pressure, R ear pain when not taking tylenol, facial pressure/pain of left maxilla, upper jaw aching. She has been taking elderberry syrup which typically  helps but hasn't improved this time. She also takes zyrtec nightly and flonase.   Her blood pressure has not been controlled at home, today their BP is BP: (!) 164/102 She denies chest pain, shortness of breath, dizziness.   BMI is Body mass index is 44.66 kg/m., she has not been working on diet and exercise. Wt Readings from Last 3 Encounters:  08/24/18 223 lb (101.2 kg)  12/01/17 215 lb (97.5 kg)  07/12/17 213 lb (96.6 kg)     Past Medical History:  Diagnosis Date  . Anemia   . Asthma   . GERD (gastroesophageal reflux disease)   . Hyperlipidemia      Allergies  Allergen Reactions  . Aspirin     Stomach pain/ burning   . Avelox [Moxifloxacin Hcl In Nacl]     hives  . Azithromycin     Flu like symptoms  . Ibuprofen     Stomach pain   . Shellfish Allergy     Current Outpatient Medications on File Prior to Visit  Medication Sig  . albuterol (PROVENTIL HFA;VENTOLIN HFA) 108 (90 Base) MCG/ACT inhaler Inhale 2 puffs into the lungs every 2 (two) hours as needed for wheezing or shortness of breath (cough).  . IRON PO Take 45 mg by mouth 2 (two) times daily.    . Norgestimate-Ethinyl Estradiol Triphasic 0.18/0.215/0.25 MG-25 MCG tab Take 1 tablet by mouth daily.  Marland Kitchen omeprazole (PRILOSEC) 20 MG capsule Take 20 mg by mouth 2 (two) times daily before a meal.  . triamcinolone ointment (KENALOG) 0.1 % Apply 1 application topically 2 (two) times daily. (Patient taking differently: Apply 1 application topically as  needed. )  . metoCLOPramide (REGLAN) 10 MG tablet Take 1 tablet (10 mg total) by mouth 3 (three) times daily with meals. (Patient taking differently: Take 10 mg by mouth as needed. )   No current facility-administered medications on file prior to visit.     ROS: all negative except above.   Physical Exam:  BP (!) 164/102   Pulse (!) 106   Temp (!) 97 F (36.1 C)   Ht 4' 11.25" (1.505 m)   Wt 223 lb (101.2 kg)   SpO2 99%   BMI 44.66 kg/m   General Appearance:  Well nourished, in no apparent distress. Eyes: PERRLA, EOMs, conjunctiva no swelling or erythema Sinuses: She has right maxillary tenderness without erythema/edema.  ENT/Mouth: Ext aud canals clear, L TM without erythema, bulging, R TM with prominent vascularture with ? Scant bleeding, no bulging, BLM visible, no purulent effusion. + serous effusion. No erythema, swelling, or exudate on post pharynx.  Tonsils not swollen or erythematous. Hearing normal. She does have very narrow nares and pale.  Neck: Supple, thyroid normal.  Respiratory: Respiratory effort normal, BS equal bilaterally without rales, rhonchi, wheezing or stridor.  Cardio: RRR with no MRGs. Brisk peripheral pulses without edema.  Abdomen: Soft, + BS.  Non tender, no guarding, rebound, hernias, masses. Lymphatics: Non tender without lymphadenopathy.  Musculoskeletal: Symmetrical strength, normal gait.  Skin: Warm, dry without rashes, lesions, ecchymosis.  Neuro: Cranial nerves intact. Normal muscle tone, no cerebellar symptoms.  Psych: Awake and oriented X 3, normal affect, Insight and Judgment appropriate.   Dan Maker, NP 1:03 PM [facility]

## 2018-09-07 ENCOUNTER — Ambulatory Visit: Payer: Self-pay | Admitting: Physician Assistant

## 2018-09-13 DIAGNOSIS — I10 Essential (primary) hypertension: Secondary | ICD-10-CM | POA: Insufficient documentation

## 2018-09-13 DIAGNOSIS — I152 Hypertension secondary to endocrine disorders: Secondary | ICD-10-CM | POA: Insufficient documentation

## 2018-09-13 NOTE — Progress Notes (Signed)
Assessment and Plan:  [name] was seen today for follow-up.  Diagnoses and all orders for this visit:   Hypertension, unspecified type Stop verapamil, SE, start ziac for BP goal <130/80 Monitor blood pressure at home; call if consistently over 130/80 Discussed DASH diet Advised to go to the ER if any CP, SOB, nausea, dizziness, severe HA, changes vision/speech, left arm numbness and tingling and jaw pain. Follow up in 4-6 weeks -     bisoprolol-hydrochlorothiazide (ZIAC) 5-6.25 MG tablet; Take 1-2 tabs by mouth daily for blood pressure goal of 130/80.  Medication management -     COMPLETE METABOLIC PANEL WITH GFR -     TSH  Morbid obesity (HCC) Long discussion about weight loss, diet, and exercise Recommended diet heavy in fruits and veggies and low in animal meats, cheeses, and dairy products, appropriate calorie intake Patient will work on increasing exercise, watching portions Discussed appropriate weight for height and initial goal (<200lb) Follow up at next visit  Further disposition pending results of labs. Discussed med's effects and SE's.   Over 30 minutes of exam, counseling, chart review, and critical decision making was performed.   No future appointments.  ------------------------------------------------------------------------------------------------------------------   HPI 37 y.o.female morbidly obese, recently initiated on medication for hypertension presents for 4 weeks follow up. She is cash pay, hasn't had labs since 2012, typically has only come in for URI/acute visits.   She was initiated on verapamil 240 mg, for hypertension, tachycardia, hx of migraines, she reports since initiating medication home values have been improved, running 145-155/90-100. This improved from previous. She is however concerned about difficulty urinating since initiating, would like to try alternate agent.   Her blood pressure has not been controlled at home, today their BP is BP:  (!) 160/98 Recheck by provider 148/102, P 98.   She does not workout. She denies chest pain, shortness of breath, dizziness.  BMI is Body mass index is 44.46 kg/m., she has been working on diet, hasn't been exercising but needs to restart as she did much better when she was exercising. She does walk a fair bit at work.  Wt Readings from Last 3 Encounters:  09/14/18 222 lb (100.7 kg)  08/24/18 223 lb (101.2 kg)  12/01/17 215 lb (97.5 kg)     Past Medical History:  Diagnosis Date  . Anemia   . Asthma   . GERD (gastroesophageal reflux disease)   . Hyperlipidemia      Allergies  Allergen Reactions  . Aspirin     Stomach pain/ burning   . Avelox [Moxifloxacin Hcl In Nacl]     hives  . Azithromycin     Flu like symptoms  . Ibuprofen     Stomach pain   . Shellfish Allergy     Current Outpatient Medications on File Prior to Visit  Medication Sig  . albuterol (PROVENTIL HFA;VENTOLIN HFA) 108 (90 Base) MCG/ACT inhaler Inhale 2 puffs into the lungs every 2 (two) hours as needed for wheezing or shortness of breath (cough).  . IRON PO Take 45 mg by mouth 2 (two) times daily.    . montelukast (SINGULAIR) 10 MG tablet Take 1 tablet (10 mg total) by mouth at bedtime.  . Norgestimate-Ethinyl Estradiol Triphasic 0.18/0.215/0.25 MG-25 MCG tab Take 1 tablet by mouth daily.  Marland Kitchen omeprazole (PRILOSEC) 20 MG capsule Take 20 mg by mouth 2 (two) times daily before a meal.  . predniSONE (DELTASONE) 10 MG tablet 4 tablets daily for 3 days, 2 tablet daily for  4 days.  Marland Kitchen. triamcinolone ointment (KENALOG) 0.1 % Apply 1 application topically 2 (two) times daily. (Patient taking differently: Apply 1 application topically as needed. )  . verapamil (CALAN-SR) 240 MG CR tablet Take 1 tablet (240 mg total) by mouth daily.  . metoCLOPramide (REGLAN) 10 MG tablet Take 1 tablet (10 mg total) by mouth 3 (three) times daily with meals. (Patient taking differently: Take 10 mg by mouth as needed. )   No current  facility-administered medications on file prior to visit.     ROS: all negative except above.   Physical Exam:  BP (!) 160/98   Pulse (!) 106   Temp 97.7 F (36.5 C)   Ht 4' 11.25" (1.505 m)   Wt 222 lb (100.7 kg)   SpO2 99%   BMI 44.46 kg/m   General Appearance: Well nourished, in no apparent distress. Eyes: PERRLA, EOMs, conjunctiva no swelling or erythema Sinuses: No Frontal/maxillary tenderness ENT/Mouth: Ext aud canals clear, TMs without erythema, bulging. No erythema, swelling, or exudate on post pharynx.  Tonsils not swollen or erythematous. Hearing normal.  Neck: Supple, thyroid normal.  Respiratory: Respiratory effort normal, BS equal bilaterally without rales, rhonchi, wheezing or stridor.  Cardio: RRR with no MRGs. Brisk peripheral pulses without edema.  Abdomen: Soft, obese abdomen, + BS.  Non tender, no guarding, rebound, hernias, masses. Lymphatics: Non tender without lymphadenopathy.  Musculoskeletal: Full ROM, 5/5 strength, normal gait.  Skin: Warm, dry without rashes, lesions, ecchymosis.  Neuro: Cranial nerves intact. Normal muscle tone, no cerebellar symptoms. Sensation intact.  Psych: Awake and oriented X 3, normal affect, Insight and Judgment appropriate.     Meagan MakerAshley C Kaisyn Reinhold, NP 11:00 AM [facility]

## 2018-09-14 ENCOUNTER — Ambulatory Visit (INDEPENDENT_AMBULATORY_CARE_PROVIDER_SITE_OTHER): Payer: Self-pay | Admitting: Adult Health

## 2018-09-14 ENCOUNTER — Encounter: Payer: Self-pay | Admitting: Adult Health

## 2018-09-14 VITALS — BP 148/102 | HR 98 | Temp 97.7°F | Ht 59.25 in | Wt 222.0 lb

## 2018-09-14 DIAGNOSIS — Z79899 Other long term (current) drug therapy: Secondary | ICD-10-CM

## 2018-09-14 DIAGNOSIS — I10 Essential (primary) hypertension: Secondary | ICD-10-CM

## 2018-09-14 LAB — COMPLETE METABOLIC PANEL WITH GFR
AG Ratio: 1.4 (calc) (ref 1.0–2.5)
ALBUMIN MSPROF: 4.1 g/dL (ref 3.6–5.1)
ALT: 19 U/L (ref 6–29)
AST: 21 U/L (ref 10–30)
Alkaline phosphatase (APISO): 79 U/L (ref 33–115)
BUN: 8 mg/dL (ref 7–25)
CO2: 22 mmol/L (ref 20–32)
CREATININE: 0.65 mg/dL (ref 0.50–1.10)
Calcium: 9.5 mg/dL (ref 8.6–10.2)
Chloride: 101 mmol/L (ref 98–110)
GFR, EST AFRICAN AMERICAN: 131 mL/min/{1.73_m2} (ref >=60)
GFR, EST NON AFRICAN AMERICAN: 113 mL/min/{1.73_m2} (ref >=60)
GLUCOSE: 77 mg/dL (ref 65–99)
Globulin: 3 g/dL (calc) (ref 1.9–3.7)
Potassium: 3.9 mmol/L (ref 3.5–5.3)
Sodium: 135 mmol/L (ref 135–146)
Total Bilirubin: 0.4 mg/dL (ref 0.2–1.2)
Total Protein: 7.1 g/dL (ref 6.1–8.1)

## 2018-09-14 LAB — TSH: TSH: 4.37 mIU/L

## 2018-09-14 MED ORDER — BISOPROLOL-HYDROCHLOROTHIAZIDE 5-6.25 MG PO TABS: ORAL_TABLET | ORAL | 1 refills | Status: DC

## 2018-09-14 NOTE — Patient Instructions (Addendum)
Take 1 tab ziac daily in the morning x 2 weeks, then increase to 2 tabs if still above BP goal  Bring log with  You to nurse visit  Goals    . Blood Pressure < 130/80    . Exercise 150 min/wk Moderate Activity    . Weight (lb) < 200 lb (90.7 kg)       Drink 1/2 your body weight in fluid ounces of water daily; drink a tall glass of water 30 min before meals  Don't eat until you're stuffed- listen to your stomach and eat until you are 80% full   Try eating off of a salad plate; wait 10 min after finishing before going back for seconds  Start by eating the vegetables on your plate; aim for 96% of your meals to be fruits or vegetables  Then eat your protein - lean meats (grass fed if possible), fish, beans, nuts in moderation  Eat your carbs/starch last ONLY if you still are hungry. If you can, stop before finishing it all  Avoid sugar and flour - the closer it looks to it's original form in nature, typically the better it is for you  Splurge in moderation - "assign" days when you get to splurge and have the "bad stuff" - I like to follow a 80% - 20% plan- "good" choices 80 % of the time, "bad" choices in moderation 20% of the time  Simple equation is: Calories out > calories in = weight loss - even if you eat the bad stuff, if you limit portions, you will still lose weight    Know what a healthy weight is for you (roughly BMI <25) and aim to maintain this  Aim for 7+ servings of fruits and vegetables daily  65-80+ fluid ounces of water or unsweet tea for healthy kidneys  Limit to max 1 drink of alcohol per day; avoid smoking/tobacco  Limit animal fats in diet for cholesterol and heart health - choose grass fed whenever available  Avoid highly processed foods, and foods high in saturated/trans fats  Aim for low stress - take time to unwind and care for your mental health  Aim for 150 min of moderate intensity exercise weekly for heart health, and weights twice weekly for  bone health  Aim for 7-9 hours of sleep daily      HYPERTENSION INFORMATION  Monitor your blood pressure at home, please keep a record and bring that in with you to your next office visit.   Go to the ER if any CP, SOB, nausea, dizziness, severe HA, changes vision/speech  Your most recent BP: BP: (!) 148/102   Take your medications faithfully as instructed. Maintain a healthy weight. Get at least 150 minutes of aerobic exercise per week. Minimize salt intake. Minimize alcohol intake  DASH Eating Plan DASH stands for "Dietary Approaches to Stop Hypertension." The DASH eating plan is a healthy eating plan that has been shown to reduce high blood pressure (hypertension). Additional health benefits may include reducing the risk of type 2 diabetes mellitus, heart disease, and stroke. The DASH eating plan may also help with weight loss. WHAT DO I NEED TO KNOW ABOUT THE DASH EATING PLAN? For the DASH eating plan, you will follow these general guidelines:  Choose foods with a percent daily value for sodium of less than 5% (as listed on the food label).  Use salt-free seasonings or herbs instead of table salt or sea salt.  Check with your health care provider  or pharmacist before using salt substitutes.  Eat lower-sodium products, often labeled as "lower sodium" or "no salt added."  Eat fresh foods.  Eat more vegetables, fruits, and low-fat dairy products.  Choose whole grains. Look for the word "whole" as the first word in the ingredient list.  Choose fish and skinless chicken or [place] more often than red meat. Limit fish, poultry, and meat to 6 oz (170 g) each day.  Limit sweets, desserts, sugars, and sugary drinks.  Choose heart-healthy fats.  Limit cheese to 1 oz (28 g) per day.  Eat more home-cooked food and less restaurant, buffet, and fast food.  Limit fried foods.  [place] foods using methods other than frying.  Limit canned vegetables. If you do use them, rinse  them well to decrease the sodium.  When eating at a restaurant, ask that your food be prepared with less salt, or no salt if possible. WHAT FOODS CAN I EAT? Seek help from a dietitian for individual calorie needs. Grains Whole grain or whole wheat bread. Brown rice. Whole grain or whole wheat pasta. Quinoa, bulgur, and whole grain cereals. Low-sodium cereals. Corn or whole wheat flour tortillas. Whole grain cornbread. Whole grain crackers. Low-sodium crackers. Vegetables Fresh or frozen vegetables (raw, steamed, roasted, or grilled). Low-sodium or reduced-sodium tomato and vegetable juices. Low-sodium or reduced-sodium tomato sauce and paste. Low-sodium or reduced-sodium canned vegetables.  Fruits All fresh, canned (in natural juice), or frozen fruits. Meat and Other Protein Products Ground beef (85% or leaner), grass-fed beef, or beef trimmed of fat. Skinless chicken or [place]. Ground chicken or [place]. Pork trimmed of fat. All fish and seafood. Eggs. Dried beans, peas, or lentils. Unsalted nuts and seeds. Unsalted canned beans. Dairy Low-fat dairy products, such as skim or 1% milk, 2% or reduced-fat cheeses, low-fat ricotta or cottage cheese, or plain low-fat yogurt. Low-sodium or reduced-sodium cheeses. Fats and Oils Tub margarines without trans fats. Light or reduced-fat mayonnaise and salad dressings (reduced sodium). Avocado. Safflower, olive, or canola oils. Natural peanut or almond butter. Other Unsalted popcorn and pretzels. The items listed above may not be a complete list of recommended foods or beverages. Contact your dietitian for more options. WHAT FOODS ARE NOT RECOMMENDED? Grains White bread. White pasta. White rice. Refined cornbread. Bagels and croissants. Crackers that contain trans fat. Vegetables Creamed or fried vegetables. Vegetables in a cheese sauce. Regular canned vegetables. Regular canned tomato sauce and paste. Regular tomato and vegetable juices. Fruits Dried  fruits. Canned fruit in light or heavy syrup. Fruit juice. Meat and Other Protein Products Fatty cuts of meat. Ribs, chicken wings, bacon, sausage, bologna, salami, chitterlings, fatback, hot dogs, bratwurst, and packaged luncheon meats. Salted nuts and seeds. Canned beans with salt. Dairy Whole or 2% milk, cream, half-and-half, and cream cheese. Whole-fat or sweetened yogurt. Full-fat cheeses or blue cheese. Nondairy creamers and whipped toppings. Processed cheese, cheese spreads, or cheese curds. Condiments Onion and garlic salt, seasoned salt, table salt, and sea salt. Canned and packaged gravies. Worcestershire sauce. Tartar sauce. Barbecue sauce. Teriyaki sauce. Soy sauce, including reduced sodium. Steak sauce. Fish sauce. Oyster sauce. Cocktail sauce. Horseradish. Ketchup and mustard. Meat flavorings and tenderizers. Bouillon cubes. Hot sauce. Tabasco sauce. Marinades. Taco seasonings. Relishes. Fats and Oils Butter, stick margarine, lard, shortening, ghee, and bacon fat. Coconut, palm kernel, or palm oils. Regular salad dressings. Other Pickles and olives. Salted popcorn and pretzels. The items listed above may not be a complete list of foods and beverages to avoid. Contact your  dietitian for more information. WHERE CAN I FIND MORE INFORMATION? National Heart, Lung, and Blood Institute: www.nhlbi.nih.gov/health/health-topics/topics/dash/ [employer].itment Released: 09/29/2011 Document Revised: 02/24/2014 Document Reviewed: 08/14/2013 [facility] Patient Information 2015 [place], [place]. This information is not intended to replace advice given to you by your health care provider. Make sure you discuss any questions you have with your health care provider.         Bisoprolol; Hydrochlorothiazide, HCTZ tablets What is this medicine? BISOPROLOL; HYDROCHLOROTHIAZIDE (bis OH proe lol; hye droe klor oh THYE a zide) is a combination of a beta-blocker and a diuretic. It is used to treat high blood  pressure. This medicine may be used for other purposes; ask your health care provider or pharmacist if you have questions. COMMON BRAND NAME(S): Ziac What should I tell my health care provider before I take this medicine? They need to know if you have any of these conditions: -circulation problems, or blood vessel disease -decreased urine -diabetes -heart disease, heart failure or a history of heart attack -kidney disease -liver disease -lung or breathing disease, like asthma -slow heart rate -thyroid disease -an unusual or allergic reaction to hydrochlorothiazide, bisoprolol, sulfa drugs, other medicines, foods, dyes, or preservatives -pregnant or trying to get pregnant -breast-feeding How should I use this medicine? Take this medicine by mouth with a glass of water. Follow the directions on the prescription label. You can take it with or without food. If it upsets your stomach, take it with food. Take your medicine at regular intervals. Do not take it more often than directed. Do not stop taking except on your doctor's advice. Talk to your pediatrician regarding the use of this medicine in children. Special care may be needed. Overdosage: If you think you have taken too much of this medicine contact a poison control center or emergency room at once. NOTE: This medicine is only for you. Do not share this medicine with others. What if I miss a dose? If you miss a dose, take it as soon as you can. If it is almost time for your next dose, take only that dose. Do not take double or extra doses. What may interact with this medicine? -barbiturates, like phenobarbital -cholestyramine -colestipol -corticosteroids, like prednisone -lithium -medicines for chest pain or angina -medicines for diabetes -medicines for high blood pressure or heart failure -medicines to control heart rhythm -NSAIDs, medicines for pain and inflammation, like ibuprofen or naproxen -prescription pain  medicines -rifampin -skeletal muscle relaxants like tubocurarine This list may not describe all possible interactions. Give your health care provider a list of all the medicines, herbs, non-prescription drugs, or dietary supplements you use. Also tell them if you smoke, drink alcohol, or use illegal drugs. Some items may interact with your medicine. What should I watch for while using this medicine? Visit your doctor or health care professional for regular checks on your progress. Check your blood pressure as directed. Ask your doctor or health care professional what your blood pressure should be and when you should contact him or her. Check with your doctor or health care professional if you get an attack of severe diarrhea, nausea and vomiting, or if you sweat a lot. The loss of too much body fluid can make it dangerous for you to take this medicine. You may get drowsy or dizzy. Do not drive, use machinery, or do anything that needs mental alertness until you know how this drug affects you. Do not stand or sit up quickly, especially if you are an older patient.  This reduces the risk of dizzy or fainting spells. Alcohol can make you more drowsy and dizzy. Avoid alcoholic drinks. This medicine may affect your blood sugar level. If you have diabetes, check with your doctor or health care professional before changing the dose of your diabetic medicine. This medicine can make you more sensitive to the sun. Keep out of the sun. If you cannot avoid being in the sun, wear protective clothing and use sunscreen. Do not use sun lamps or tanning beds/booths. Do not treat yourself for coughs, colds, or pain while you are taking this medicine without asking your doctor or health care professional for advice. Some ingredients may increase your blood pressure. What side effects may I notice from receiving this medicine? Side effects that you should report to your doctor or health care professional as soon as  possible: -allergic reactions like skin rash, itching or hives, swelling of the face, lips, or tongue -breathing problems -changes in vision -chest pain -cold, tingling, or numb hands or feet -eye pain -fast, irregular, or slow heartbeat -increased thirst or sweating -muscle cramps -redness, blistering, peeling or loosening of the skin, including inside the mouth -swollen legs or ankles -tremors -unusual bruising -unusual weak or tired -vomiting -worsened gout pain -yellowing of the eyes or skin Side effects that usually do not require medical attention (report to your doctor or health care professional if they continue or are bothersome): -change in sex drive or performance -cough -depression -diarrhea -nausea This list may not describe all possible side effects. Call your doctor for medical advice about side effects. You may report side effects to FDA at 1-800-FDA-1088. Where should I keep my medicine? Keep out of the reach of children. Store at room temperature between 15 and 30 degrees C (59 and 86 degrees F). Keep container tightly closed. Throw away any unused medicine after the expiration date. NOTE: This sheet is a summary. It may not cover all possible information. If you have questions about this medicine, talk to your doctor, pharmacist, or health care provider.  2018 Elsevier/Gold Standard (2010-06-30 12:53:54)

## 2018-10-26 ENCOUNTER — Ambulatory Visit: Payer: Self-pay

## 2018-11-02 ENCOUNTER — Ambulatory Visit (INDEPENDENT_AMBULATORY_CARE_PROVIDER_SITE_OTHER): Payer: Self-pay | Admitting: *Deleted

## 2018-11-02 VITALS — BP 144/90 | HR 80 | Temp 97.8°F | Resp 16 | Ht 59.25 in | Wt 224.8 lb

## 2018-11-02 DIAGNOSIS — I10 Essential (primary) hypertension: Secondary | ICD-10-CM

## 2018-11-02 NOTE — Progress Notes (Signed)
Patient is here for a NV to recheck her BP.  She is taking Ziac 5-6.25 mg daily at bedtime.  Her BP is 144/90 today. Her Previous BP on 09/14/2018 was 148/102. Per Judd Gaudier, NP, the patient should increase her Ziac 5-6.25 to 2 tablets at bedtime and continue to check her BP.  The patient will have an OV in 12 weeks with Morrie Sheldon.

## 2018-12-07 ENCOUNTER — Other Ambulatory Visit: Payer: Self-pay | Admitting: Adult Health

## 2018-12-07 DIAGNOSIS — J0101 Acute recurrent maxillary sinusitis: Secondary | ICD-10-CM

## 2019-01-08 ENCOUNTER — Other Ambulatory Visit: Payer: Self-pay | Admitting: Adult Health

## 2019-01-08 MED ORDER — NORGESTIM-ETH ESTRAD TRIPHASIC 0.18/0.215/0.25 MG-25 MCG PO TABS: 1.0000 | ORAL_TABLET | Freq: Every day | ORAL | 2 refills | Status: DC

## 2019-01-23 NOTE — Progress Notes (Deleted)
Assessment and Plan:  [name] was seen today for follow-up.  Diagnoses and all orders for this visit:   Hypertension, unspecified type Stop verapamil, SE, start ziac for BP goal <130/80 Monitor blood pressure at home; call if consistently over 130/80 Discussed DASH diet Advised to go to the ER if any CP, SOB, nausea, dizziness, severe HA, changes vision/speech, left arm numbness and tingling and jaw pain. Follow up in 4-6 weeks -     bisoprolol-hydrochlorothiazide (ZIAC) 5-6.25 MG tablet; Take 1-2 tabs by mouth daily for blood pressure goal of 130/80.  Medication management -     COMPLETE METABOLIC PANEL WITH GFR -     TSH  Morbid obesity (HCC) Long discussion about weight loss, diet, and exercise Recommended diet heavy in fruits and veggies and low in animal meats, cheeses, and dairy products, appropriate calorie intake Patient will work on increasing exercise, watching portions Discussed appropriate weight for height and initial goal (<200lb) Follow up at next visit  Further disposition pending results of labs. Discussed med's effects and SE's.   Over 30 minutes of exam, counseling, chart review, and critical decision making was performed.   Future Appointments  Date Time Provider Department Center  01/25/2019  9:15 AM Judd Gaudier, NP [facility] None  03/25/2019  8:45 AM Judd Gaudier, NP [facility] None    ------------------------------------------------------------------------------------------------------------------   HPI 38 y.o.female morbidly obese, recently initiated on medication for hypertension presents for 12 weeks follow up. She is cash pay, checked basic labs   Her blood pressure has not been controlled at home, today their BP is   Recheck by provider 148/102, P 98.   She does not workout. She denies chest pain, shortness of breath, dizziness.  BMI is There is no height or weight on file to calculate BMI., she has been working on diet, hasn't been  exercising but needs to restart as she did much better when she was exercising. She does walk a fair bit at work.  Wt Readings from Last 3 Encounters:  11/02/18 224 lb 12.8 oz (102 kg)  09/14/18 222 lb (100.7 kg)  08/24/18 223 lb (101.2 kg)     Past Medical History:  Diagnosis Date  . Anemia   . Asthma   . GERD (gastroesophageal reflux disease)   . Hyperlipidemia      Allergies  Allergen Reactions  . Aspirin     Stomach pain/ burning   . Avelox [Moxifloxacin Hcl In Nacl]     hives  . Azithromycin     Flu like symptoms  . Ibuprofen     Stomach pain   . Shellfish Allergy     Current Outpatient Medications on File Prior to Visit  Medication Sig  . albuterol (PROVENTIL HFA;VENTOLIN HFA) 108 (90 Base) MCG/ACT inhaler Inhale 2 puffs into the lungs every 2 (two) hours as needed for wheezing or shortness of breath (cough).  . bisoprolol-hydrochlorothiazide (ZIAC) 5-6.25 MG tablet Take 1-2 tabs by mouth daily for blood pressure goal of 130/80.  Marland Kitchen IRON PO Take 45 mg by mouth 2 (two) times daily.    . metoCLOPramide (REGLAN) 10 MG tablet Take 1 tablet (10 mg total) by mouth 3 (three) times daily with meals. (Patient taking differently: Take 10 mg by mouth as needed. )  . montelukast (SINGULAIR) 10 MG tablet Take 1 tablet daily for Allergies  . Norgestimate-Ethinyl Estradiol Triphasic 0.18/0.215/0.25 MG-25 MCG tab Take 1 tablet by mouth daily.  Marland Kitchen omeprazole (PRILOSEC) 20 MG capsule Take 20 mg by mouth  2 (two) times daily before a meal.  . predniSONE (DELTASONE) 10 MG tablet 4 tablets daily for 3 days, 2 tablet daily for 4 days.  Marland Kitchen triamcinolone ointment (KENALOG) 0.1 % Apply 1 application topically 2 (two) times daily. (Patient taking differently: Apply 1 application topically as needed. )  . verapamil (CALAN-SR) 240 MG CR tablet Take 1 tablet (240 mg total) by mouth daily.   No current facility-administered medications on file prior to visit.     ROS: all negative except above.    Physical Exam:  There were no vitals taken for this visit.  General Appearance: Well nourished, in no apparent distress. Eyes: PERRLA, EOMs, conjunctiva no swelling or erythema Sinuses: No Frontal/maxillary tenderness ENT/Mouth: Ext aud canals clear, TMs without erythema, bulging. No erythema, swelling, or exudate on post pharynx.  Tonsils not swollen or erythematous. Hearing normal.  Neck: Supple, thyroid normal.  Respiratory: Respiratory effort normal, BS equal bilaterally without rales, rhonchi, wheezing or stridor.  Cardio: RRR with no MRGs. Brisk peripheral pulses without edema.  Abdomen: Soft, obese abdomen, + BS.  Non tender, no guarding, rebound, hernias, masses. Lymphatics: Non tender without lymphadenopathy.  Musculoskeletal: Full ROM, 5/5 strength, normal gait.  Skin: Warm, dry without rashes, lesions, ecchymosis.  Neuro: Cranial nerves intact. Normal muscle tone, no cerebellar symptoms. Sensation intact.  Psych: Awake and oriented X 3, normal affect, Insight and Judgment appropriate.     Dan Maker, NP 1:46 PM [facility]

## 2019-01-25 ENCOUNTER — Ambulatory Visit: Payer: Self-pay | Admitting: Adult Health

## 2019-01-25 NOTE — Progress Notes (Signed)
Virtual Visit via Telephone Note  I connected with [name] on 01/28/19 at  2:30 PM EDT by telephone and verified that I am speaking with the correct person using two identifiers.   I discussed the limitations, risks, security and privacy concerns of performing an evaluation and management service by telephone and the availability of in person appointments. I also discussed with the patient that there may be a patient responsible charge related to this service. The patient expressed understanding and agreed to proceed.  I discussed the assessment and treatment plan with the patient. The patient was provided an opportunity to ask questions and all were answered. The patient agreed with the plan and demonstrated an understanding of the instructions.   The patient was advised to call back or seek an in-person evaluation if the symptoms worsen or if the condition fails to improve as anticipated.  I provided 15 minutes of non-face-to-face time during this encounter.   Dan Maker, NP    Assessment and Plan:  Hypertension, unspecified type Continue ziac 10-12.5 mg daily for BP goal <130/80 Monitor blood pressure at home; call if consistently over 130/80 Discussed DASH diet Advised to go to the ER if any CP, SOB, nausea, dizziness, severe HA, changes vision/speech, left arm numbness and tingling and jaw pain. Follow up in 4-6 weeks -     bisoprolol-hydrochlorothiazide (ZIAC) 5-6.25 MG tablet; Take 2 tabs by mouth daily for blood pressure goal of 130/80.  Morbid obesity (HCC) Long discussion about weight loss, diet, and exercise Recommended diet heavy in fruits and veggies and low in animal meats, cheeses, and dairy products, appropriate calorie intake Patient will work on increasing exercise, watching portions Discussed appropriate weight for height and initial goal (<200lb) Follow up at next visit  Further disposition pending results of labs. Discussed med's effects and SE's.    Over 15 minutes of exam, counseling, chart review, and critical decision making was performed.   Future Appointments  Date Time Provider Department Center  03/25/2019  8:45 AM Judd Gaudier, NP [facility] None    ------------------------------------------------------------------------------------------------------------------   HPI 38 y.o.female morbidly obese, recently initiated on medication for hypertension presents for 12 weeks follow up. She is cash pay   Her blood pressure has been controlled at home (consistently 120s/60s) taking bisoprolol-hctz 10-12.5 mg daily, today their BP is BP: 121/65   She does not workout. She denies chest pain, shortness of breath, dizziness.  BMI is Body mass index is 43.06 kg/m., she has been working on diet, hasn't been exercising but needs to restart as she did much better when she was exercising. She does walk a fair bit at work. She is ambivalent about working on weight.  Wt Readings from Last 3 Encounters:  01/28/19 215 lb (97.5 kg)  11/02/18 224 lb 12.8 oz (102 kg)  09/14/18 222 lb (100.7 kg)     Past Medical History:  Diagnosis Date  . Anemia   . Asthma   . GERD (gastroesophageal reflux disease)   . Hyperlipidemia      Allergies  Allergen Reactions  . Aspirin     Stomach pain/ burning   . Avelox [Moxifloxacin Hcl In Nacl]     hives  . Azithromycin     Flu like symptoms  . Ibuprofen     Stomach pain   . Shellfish Allergy     Current Outpatient Medications on File Prior to Visit  Medication Sig  . albuterol (PROVENTIL HFA;VENTOLIN HFA) 108 (90 Base) MCG/ACT inhaler  Inhale 2 puffs into the lungs every 2 (two) hours as needed for wheezing or shortness of breath (cough).  . bisoprolol-hydrochlorothiazide (ZIAC) 5-6.25 MG tablet Take 1-2 tabs by mouth daily for blood pressure goal of 130/80.  Marland Kitchen IRON PO Take 45 mg by mouth 2 (two) times daily.    . metoCLOPramide (REGLAN) 10 MG tablet Take 1 tablet (10 mg total) by mouth 3  (three) times daily with meals. (Patient taking differently: Take 10 mg by mouth as needed. )  . montelukast (SINGULAIR) 10 MG tablet Take 1 tablet daily for Allergies  . Norgestimate-Ethinyl Estradiol Triphasic 0.18/0.215/0.25 MG-25 MCG tab Take 1 tablet by mouth daily.  Marland Kitchen omeprazole (PRILOSEC) 20 MG capsule Take 20 mg by mouth 2 (two) times daily before a meal.  . triamcinolone ointment (KENALOG) 0.1 % Apply 1 application topically 2 (two) times daily. (Patient taking differently: Apply 1 application topically as needed. )   No current facility-administered medications on file prior to visit.     ROS: all negative except above.   Physical Exam:  BP 121/65   Pulse 74   Wt 215 lb (97.5 kg)   BMI 43.06 kg/m   General Appearance:Well sounding, in no apparent distress.  ENT/Mouth: No hoarseness, No cough for duration of visit.  Respiratory: completing full sentences without distress, without audible wheeze Neuro: Awake and oriented X 3,  Psych:  Insight and Judgment appropriate.     Dan Maker, NP 2:31 PM [facility]

## 2019-01-28 ENCOUNTER — Other Ambulatory Visit: Payer: Self-pay

## 2019-01-28 ENCOUNTER — Ambulatory Visit: Payer: Self-pay | Admitting: Adult Health

## 2019-01-28 ENCOUNTER — Encounter: Payer: Self-pay | Admitting: Adult Health

## 2019-01-28 VITALS — BP 121/65 | HR 74 | Wt 215.0 lb

## 2019-01-28 DIAGNOSIS — I10 Essential (primary) hypertension: Secondary | ICD-10-CM

## 2019-01-28 MED ORDER — BISOPROLOL-HYDROCHLOROTHIAZIDE 5-6.25 MG PO TABS: ORAL_TABLET | ORAL | 1 refills | Status: DC

## 2019-02-01 ENCOUNTER — Other Ambulatory Visit: Payer: Self-pay | Admitting: Adult Health

## 2019-02-22 ENCOUNTER — Encounter: Payer: Self-pay | Admitting: Adult Health

## 2019-02-22 ENCOUNTER — Other Ambulatory Visit: Payer: Self-pay

## 2019-02-22 ENCOUNTER — Ambulatory Visit: Payer: Self-pay | Admitting: Adult Health

## 2019-02-22 DIAGNOSIS — R21 Rash and other nonspecific skin eruption: Secondary | ICD-10-CM

## 2019-02-22 MED ORDER — TRIAMCINOLONE ACETONIDE 0.5 % EX OINT: 1.0000 "application " | TOPICAL_OINTMENT | Freq: Two times a day (BID) | CUTANEOUS | 0 refills | Status: DC

## 2019-02-22 MED ORDER — DOXYCYCLINE HYCLATE 100 MG PO CAPS: ORAL_CAPSULE | ORAL | 0 refills | Status: DC

## 2019-02-22 NOTE — Patient Instructions (Signed)
Please start doxycycline and triamcinolone ointment as prescribed.   Monitor closely - continue to avoid excessive heat, take a shower with mild soap after perspiring, avoid new products, avoid scented products  Please let me know if rash doesn't improve or if it gets worse   Rash, Adult A rash is a change in the color of your skin. A rash can also change the way your skin feels. There are many different conditions and factors that can cause a rash. Some rashes may disappear after a few days, but some may last for a few weeks. Common causes of rashes include:  Viral infections, such as: ? Colds. ? Measles. ? Hand, foot, and mouth disease.  Bacterial infections, such as: ? Scarlet fever. ? Impetigo.  Fungal infections, such as Candida.  Allergic reactions to food, medicines, or skin care products. Follow these instructions at home: The goal of treatment is to stop the itching and keep the rash from spreading. Pay attention to any changes in your symptoms. Follow these instructions to help with your condition: Medicine Take or apply over-the-counter and prescription medicines only as told by your health care provider. These may include:  Corticosteroid creams to treat red or swollen skin.  Anti-itch lotions.  Oral allergy medicines (antihistamines).  Oral corticosteroids for severe symptoms.  Skin care  Apply cool compresses to the affected areas.  Do not scratch or rub your skin.  Avoid covering the rash. Make sure the rash is exposed to air as much as possible. Managing itching and discomfort  Avoid hot showers or baths, which can make itching worse. A cold shower may help.  Try taking a bath with: ? Epsom salts. Follow manufacturer instructions on the packaging. You can get these at your local pharmacy or grocery store. ? Baking soda. Pour a small amount into the bath as told by your health care provider. ? Colloidal oatmeal. Follow manufacturer instructions on the  packaging. You can get this at your local pharmacy or grocery store.  Try applying baking soda paste to your skin. Stir water into baking soda until it reaches a paste-like consistency.  Try applying calamine lotion. This is an over-the-counter lotion that helps to relieve itchiness.  Keep cool and out of the sun. Sweating and being hot can make itching worse. General instructions   Rest as needed.  Drink enough fluid to keep your urine pale yellow.  Wear loose-fitting clothing.  Avoid scented soaps, detergents, and perfumes. Use gentle soaps, detergents, perfumes, and other cosmetic products.  Avoid any substance that causes your rash. Keep a journal to help track what causes your rash. Write down: ? What you eat. ? What cosmetic products you use. ? What you drink. ? What you wear. This includes jewelry.  Keep all follow-up visits as told by your health care provider. This is important. Contact a health care provider if:  You sweat at night.  You lose weight.  You urinate more than normal.  You urinate less than normal, or you notice that your urine is a darker color than usual.  You feel weak.  You vomit.  Your skin or the whites of your eyes look yellow (jaundice).  Your skin: ? Tingles. ? Is numb.  Your rash: ? Does not go away after several days. ? Gets worse.  You are: ? Unusually thirsty. ? More tired than normal.  You have: ? New symptoms. ? Pain in your abdomen. ? A fever. ? Diarrhea. Get help right away if you:  Have a fever and your symptoms suddenly get worse.  Develop confusion.  Have a severe headache or a stiff neck.  Have severe joint pains or stiffness.  Have a seizure.  Develop a rash that covers all or most of your body. The rash may or may not be painful.  Develop blisters that: ? Are on top of the rash. ? Grow larger or grow together. ? Are painful. ? Are inside your nose or mouth.  Develop a rash that: ? Looks like  purple pinprick-sized spots all over your body. ? Has a "bull's eye" or looks like a target. ? Is not related to sun exposure, is red and painful, and causes your skin to peel. Summary  A rash is a change in the color of your skin. Some rashes disappear after a few days, but some may last for a few weeks.  The goal of treatment is to stop the itching and keep the rash from spreading.  Take or apply over-the-counter and prescription medicines only as told by your health care provider.  Contact a health care provider if you have new or worsening symptoms.  Keep all follow-up visits as told by your health care provider. This is important. This information is not intended to replace advice given to you by your health care provider. Make sure you discuss any questions you have with your health care provider. Document Released: 09/30/2002 Document Revised: 05/14/2018 Document Reviewed: 05/14/2018 Elsevier Interactive Patient Education  2019 [employer].    Doxycycline tablets or capsules What is this medicine? DOXYCYCLINE (dox i SYE kleen) is a tetracycline antibiotic. It kills certain bacteria or stops their growth. It is used to treat many kinds of infections, like dental, skin, respiratory, and urinary tract infections. It also treats acne, Lyme disease, malaria, and certain sexually transmitted infections. This medicine may be used for other purposes; ask your health care provider or pharmacist if you have questions. COMMON BRAND NAME(S): Acticlate, Adoxa, Adoxa CK, Adoxa Pak, Adoxa TT, Alodox, Avidoxy, Doxal, LYMEPAK, Mondoxyne NL, Monodox, Morgidox 1x, Morgidox 1x Kit, Morgidox 2x, Morgidox 2x Kit, NutriDox, Ocudox, TARGADOX, Vibra-Tabs, Vibramycin What should I tell my health care provider before I take this medicine? They need to know if you have any of these conditions: -liver disease -long exposure to sunlight like working outdoors -stomach problems like colitis -an unusual or  allergic reaction to doxycycline, tetracycline antibiotics, other medicines, foods, dyes, or preservatives -pregnant or trying to get pregnant -breast-feeding How should I use this medicine? Take this medicine by mouth with a full glass of water. Follow the directions on the prescription label. It is best to take this medicine without food, but if it upsets your stomach take it with food. Take your medicine at regular intervals. Do not take your medicine more often than directed. Take all of your medicine as directed even if you think you are better. Do not skip doses or stop your medicine early. Talk to your pediatrician regarding the use of this medicine in children. While this drug may be prescribed for selected conditions, precautions do apply. Overdosage: If you think you have taken too much of this medicine contact a poison control center or emergency room at once. NOTE: This medicine is only for you. Do not share this medicine with others. What if I miss a dose? If you miss a dose, take it as soon as you can. If it is almost time for your next dose, take only that dose. Do not take  double or extra doses. What may interact with this medicine? -antacids -barbiturates -birth control pills -bismuth subsalicylate -carbamazepine -methoxyflurane -other antibiotics -phenytoin -vitamins that contain iron -warfarin This list may not describe all possible interactions. Give your health care provider a list of all the medicines, herbs, non-prescription drugs, or dietary supplements you use. Also tell them if you smoke, drink alcohol, or use illegal drugs. Some items may interact with your medicine. What should I watch for while using this medicine? Tell your doctor or health care professional if your symptoms do not improve. Do not treat diarrhea with over the counter products. Contact your doctor if you have diarrhea that lasts more than 2 days or if it is severe and watery. Do not take this  medicine just before going to bed. It may not dissolve properly when you lay down and can cause pain in your throat. Drink plenty of fluids while taking this medicine to also help reduce irritation in your throat. This medicine can make you more sensitive to the sun. Keep out of the sun. If you cannot avoid being in the sun, wear protective clothing and use sunscreen. Do not use sun lamps or tanning beds/booths. Birth control pills may not work properly while you are taking this medicine. Talk to your doctor about using an extra method of birth control. If you are being treated for a sexually transmitted infection, avoid sexual contact until you have finished your treatment. Your sexual partner may also need treatment. Avoid antacids, aluminum, calcium, magnesium, and iron products for 4 hours before and 2 hours after taking a dose of this medicine. If you are using this medicine to prevent malaria, you should still protect yourself from contact with mosquitos. Stay in screened-in areas, use mosquito nets, keep your body covered, and use an insect repellent. What side effects may I notice from receiving this medicine? Side effects that you should report to your doctor or health care professional as soon as possible: -allergic reactions like skin rash, itching or hives, swelling of the face, lips, or tongue -difficulty breathing -fever -itching in the rectal or genital area -pain on swallowing -redness, blistering, peeling or loosening of the skin, including inside the mouth -severe stomach pain or cramps -unusual bleeding or bruising -unusually weak or tired -yellowing of the eyes or skin Side effects that usually do not require medical attention (report to your doctor or health care professional if they continue or are bothersome): -diarrhea -loss of appetite -nausea, vomiting This list may not describe all possible side effects. Call your doctor for medical advice about side effects. You may  report side effects to FDA at 1-800-FDA-1088. Where should I keep my medicine? Keep out of the reach of children. Store at room temperature, below 30 degrees C (86 degrees F). Protect from light. Keep container tightly closed. Throw away any unused medicine after the expiration date. Taking this medicine after the expiration date can make you seriously ill. NOTE: This sheet is a summary. It may not cover all possible information. If you have questions about this medicine, talk to your doctor, pharmacist, or health care provider.  2019 Elsevier/Gold Standard (2015-11-11 17:11:22)     Triamcinolone skin cream, ointment, lotion, or aerosol What is this medicine? TRIAMCINOLONE (trye am SIN oh lone) is a corticosteroid. It is used on the skin to reduce swelling, redness, itching, and allergic reactions. This medicine may be used for other purposes; ask your health care provider or pharmacist if you have questions. COMMON BRAND  NAME(S): Aristocort, Aristocort A, Aristocort HP, Cinalog, Cinolar, DERMASORB TA Complete, Flutex, Kenalog, Pediaderm TA, SP Rx 228, Triacet, Trianex, Triderm What should I tell my health care provider before I take this medicine? They need to know if you have any of these conditions: -diabetes -infection, like tuberculosis, herpes, or fungal infection -large areas of burned or damaged skin -skin wasting or thinning -an unusual or allergic reaction to triamcinolone, corticosteroids, other medicines, foods, dyes, or preservatives -pregnant or trying to get pregnant -breast-feeding How should I use this medicine? This medicine is for external use only. Do not take by mouth. Follow the directions on the prescription label. Wash your hands before and after use. Apply a thin film of medicine to the affected area. Do not cover with a bandage or dressing unless your doctor or health care professional tells you to. Do not use on healthy skin or over large areas of skin. Do not get  this medicine in your eyes. If you do, rinse out with plenty of cool tap water. It is important not to use more medicine than prescribed. Do not use your medicine more often than directed. Talk to your pediatrician regarding the use of this medicine in children. Special care may be needed. Elderly patients are more likely to have damaged skin through aging, and this may increase side effects. This medicine should only be used for brief periods and infrequently in older patients. Overdosage: If you think you have taken too much of this medicine contact a poison control center or emergency room at once. NOTE: This medicine is only for you. Do not share this medicine with others. What if I miss a dose? If you miss a dose, use it as soon as you can. If it is almost time for your next dose, use only that dose. Do not use double or extra doses. What may interact with this medicine? Interactions are not expected. This list may not describe all possible interactions. Give your health care provider a list of all the medicines, herbs, non-prescription drugs, or dietary supplements you use. Also tell them if you smoke, drink alcohol, or use illegal drugs. Some items may interact with your medicine. What should I watch for while using this medicine? Tell your doctor or health care professional if your symptoms do not start to get better within one week. Do not use for more than 14 days. Do not use on healthy skin or over large areas of skin. Tell your doctor or health care professional if you are exposed to anyone with measles or chickenpox, or if you develop sores or blisters that do not heal properly. Do not use an airtight bandage to cover the affected area unless your doctor or health care professional tells you to. If you are to cover the area, follow the instructions carefully. Covering the area where the medicine is applied can increase the amount that passes through the skin and increases the risk of side  effects. If treating the diaper area of a child, avoid covering the treated area with tight-fitting diapers or plastic pants. This may increase the amount of medicine that passes through the skin and increase the risk of serious side effects. What side effects may I notice from receiving this medicine? Side effects that you should report to your doctor or health care professional as soon as possible: -burning or itching of the skin -dark red spots on the skin -infection -painful, red, pus filled blisters in hair follicles -thinning of the skin, sunburn  more likely especially on the face Side effects that usually do not require medical attention (report to your doctor or health care professional if they continue or are bothersome): -dry skin, irritation -unusual increased growth of hair on the face or body This list may not describe all possible side effects. Call your doctor for medical advice about side effects. You may report side effects to FDA at 1-800-FDA-1088. Where should I keep my medicine? Keep out of the reach of children. Store at room temperature between 15 and 30 degrees C (59 and 86 degrees F). Do not freeze. Throw away any unused medicine after the expiration date. NOTE: This sheet is a summary. It may not cover all possible information. If you have questions about this medicine, talk to your doctor, pharmacist, or health care provider.  2019 Elsevier/Gold Standard (2014-01-30 15:59:51)

## 2019-02-22 NOTE — Progress Notes (Signed)
THIS ENCOUNTER IS A VIRTUAL VISIT DUE TO COVID-19 - PATIENT WAS NOT SEEN IN THE OFFICE.  PATIENT HAS CONSENTED TO VIRTUAL VISIT / TELEMEDICINE VISIT   Virtual Visit via video Note  I connected with [name] on 02/22/2019 02/22/2019  by video.  I verified that I am speaking with the correct person using two identifiers.    I discussed the limitations of evaluation and management by telemedicine and the availability of in person appointments. The patient expressed understanding and agreed to proceed.  History of Present Illness:  There were no vitals taken for this visit.   38 y.o. with reported history of mild eczema reports new pruritis rash of R side of abdomen x 2-3 weeks. She reports she initially thought this was heat rash, adjusted temp, has since moved to cooler home but with no significant improvement. She denies changes in hygiene products, detergents, unusual activities or new medications or foods recently. She has tried triamcinolone 0.1% BID intermittently which she reports helps with itching some but hasn't notably improved the rash. She hasn't taken in the last few days and denies any worsening/spreading of rash.   No known tick or insect bites.   Past Medical History:  Diagnosis Date  . Anemia   . Asthma   . GERD (gastroesophageal reflux disease)   . Hyperlipidemia      Medications  Current Outpatient Medications (Endocrine & Metabolic):  Marland Kitchen.  TRI-LO-MARZIA 0.18/0.215/0.25 MG-25 MCG tab, TAKE ONE TABLET BY MOUTH DAILY  Current Outpatient Medications (Cardiovascular):  .  bisoprolol-hydrochlorothiazide (ZIAC) 5-6.25 MG tablet, Take 2 tabs by mouth daily for blood pressure goal of 130/80.  Current Outpatient Medications (Respiratory):  .  albuterol (PROVENTIL HFA;VENTOLIN HFA) 108 (90 Base) MCG/ACT inhaler, Inhale 2 puffs into the lungs every 2 (two) hours as needed for wheezing or shortness of breath (cough). .  montelukast (SINGULAIR) 10 MG tablet, Take 1 tablet  daily for Allergies   Current Outpatient Medications (Hematological):  Marland Kitchen.  IRON PO, Take 45 mg by mouth 2 (two) times daily.    Current Outpatient Medications (Other):  .  doxycycline (VIBRAMYCIN) 100 MG capsule, Take 1 capsule twice daily with food .  metoCLOPramide (REGLAN) 10 MG tablet, Take 1 tablet (10 mg total) by mouth 3 (three) times daily with meals. (Patient taking differently: Take 10 mg by mouth as needed. ) .  omeprazole (PRILOSEC) 20 MG capsule, Take 20 mg by mouth 2 (two) times daily before a meal. .  triamcinolone ointment (KENALOG) 0.5 %, Apply 1 application topically 2 (two) times daily.   Allergies  Allergen Reactions  . Aspirin     Stomach pain/ burning   . Avelox [Moxifloxacin Hcl In Nacl]     hives  . Azithromycin     Flu like symptoms  . Ibuprofen     Stomach pain   . Shellfish Allergy     Problem list She has Appendicitis; Recurrent otitis media of both ears; Hypertension; and Morbid obesity (HCC) on their problem list.    Review of Systems  Constitutional: Negative for chills, fever and malaise/fatigue.  HENT: Negative for sore throat.   Respiratory: Negative for cough, shortness of breath and wheezing.   Cardiovascular: Negative for chest pain and palpitations.  Gastrointestinal: Negative for nausea.  Musculoskeletal: Negative for joint pain and myalgias.  Skin: Positive for itching and rash.  Neurological: Negative for dizziness, sensory change and headaches.  Psychiatric/Behavioral: The patient is not nervous/anxious and does not have insomnia.  Observations/Objective:  General Appearance: Well nourished well developed, morbidly obese, in no apparent distress.  Eyes: conjunctiva no swelling or erythema ENT/Mouth: No hoarseness, No cough for duration of visit.  Neck: Supple  Respiratory: Respiratory effort normal, normal rate, no retractions or distress.   Cardio: Appears well-perfused, noncyanotic Musculoskeletal: no obvious  deformity Skin: visible skin without ecchymosis, erythema; she has scattered rash, appears follicular/ or mild scattered papular rash of R abdomen, not extending under breasts Neuro: Awake and oriented X 3,  Psych:  normal affect, Insight and Judgment appropriate.   Assessment and Plan:  Diagnoses and all orders for this visit:  Rash and nonspecific skin eruption Unclear etiology, though appearance somewhat suggestive or folliculitis Will try doxycycline after discussion with patient, will try higher dose of steroid concurrently as this has provided some relief of pruritis She will follow up if not improving in ~7 days Message via mychart or call if any new sx or with questions or concerns.  -     triamcinolone ointment (KENALOG) 0.5 %; Apply 1 application topically 2 (two) times daily. -     doxycycline (VIBRAMYCIN) 100 MG capsule; Take 1 capsule twice daily with food   Follow Up Instructions:    I discussed the assessment and treatment plan with the patient. The patient was provided an opportunity to ask questions and all were answered. The patient agreed with the plan and demonstrated an understanding of the instructions.   The patient was advised to call back or seek an in-person evaluation if the symptoms worsen or if the condition fails to improve as anticipated.  I provided 15 minutes of non-face-to-face time during this encounter.   Dan Maker, NP

## 2019-03-05 ENCOUNTER — Other Ambulatory Visit: Payer: Self-pay | Admitting: Adult Health

## 2019-03-05 DIAGNOSIS — R21 Rash and other nonspecific skin eruption: Secondary | ICD-10-CM

## 2019-03-05 MED ORDER — PREDNISONE 20 MG PO TABS: ORAL_TABLET | ORAL | 0 refills | Status: DC

## 2019-03-16 ENCOUNTER — Other Ambulatory Visit: Payer: Self-pay | Admitting: Internal Medicine

## 2019-03-16 DIAGNOSIS — J0101 Acute recurrent maxillary sinusitis: Secondary | ICD-10-CM

## 2019-03-21 NOTE — Progress Notes (Deleted)
Assessment and Plan:  [name] was seen today for follow-up.  Diagnoses and all orders for this visit:   Hypertension, unspecified type Stop verapamil, SE, start ziac for BP goal <130/80 Monitor blood pressure at home; call if consistently over 130/80 Discussed DASH diet Advised to go to the ER if any CP, SOB, nausea, dizziness, severe HA, changes vision/speech, left arm numbness and tingling and jaw pain. Follow up in 4-6 weeks -     bisoprolol-hydrochlorothiazide (ZIAC) 5-6.25 MG tablet; Take 1-2 tabs by mouth daily for blood pressure goal of 130/80.  Medication management -     COMPLETE METABOLIC PANEL WITH GFR -     TSH  Morbid obesity (HCC) Long discussion about weight loss, diet, and exercise Recommended diet heavy in fruits and veggies and low in animal meats, cheeses, and dairy products, appropriate calorie intake Patient will work on increasing exercise, watching portions Discussed appropriate weight for height and initial goal (<200lb) Follow up at next visit  Further disposition pending results of labs. Discussed med's effects and SE's.   Over 30 minutes of exam, counseling, chart review, and critical decision making was performed.   Future Appointments  Date Time Provider Department Center  03/25/2019  8:45 AM Judd Gaudierorbett, Janice Seales, NP [facility] None    ------------------------------------------------------------------------------------------------------------------   HPI 38 y.o.female morbidly obese, recently initiated on medication for hypertension presents for 4 weeks follow up. She is cash pay, hasn't had labs since 2012, typically has only come in for URI/acute visits.   She was initiated on verapamil 240 mg, for hypertension, tachycardia, hx of migraines, she reports since initiating medication home values have been improved, running 145-155/90-100. This improved from previous. She is however concerned about difficulty urinating since initiating, would like to try  alternate agent.   Her blood pressure has not been controlled at home, today their BP is   Recheck by provider 148/102, P 98.   She does not workout. She denies chest pain, shortness of breath, dizziness.  BMI is There is no height or weight on file to calculate BMI., she has been working on diet, hasn't been exercising but needs to restart as she did much better when she was exercising. She does walk a fair bit at work.  Wt Readings from Last 3 Encounters:  01/28/19 215 lb (97.5 kg)  11/02/18 224 lb 12.8 oz (102 kg)  09/14/18 222 lb (100.7 kg)     Past Medical History:  Diagnosis Date  . Anemia   . Asthma   . GERD (gastroesophageal reflux disease)   . Hyperlipidemia      Allergies  Allergen Reactions  . Aspirin     Stomach pain/ burning   . Avelox [Moxifloxacin Hcl In Nacl]     hives  . Azithromycin     Flu like symptoms  . Ibuprofen     Stomach pain   . Shellfish Allergy     Current Outpatient Medications on File Prior to Visit  Medication Sig  . albuterol (PROVENTIL HFA;VENTOLIN HFA) 108 (90 Base) MCG/ACT inhaler Inhale 2 puffs into the lungs every 2 (two) hours as needed for wheezing or shortness of breath (cough).  . bisoprolol-hydrochlorothiazide (ZIAC) 5-6.25 MG tablet Take 2 tabs by mouth daily for blood pressure goal of 130/80.  Marland Kitchen. IRON PO Take 45 mg by mouth 2 (two) times daily.    . metoCLOPramide (REGLAN) 10 MG tablet Take 1 tablet (10 mg total) by mouth 3 (three) times daily with meals. (Patient taking differently: Take 10  mg by mouth as needed. )  . montelukast (SINGULAIR) 10 MG tablet Take 1 tablet Daily for Allergies  . omeprazole (PRILOSEC) 20 MG capsule Take 20 mg by mouth 2 (two) times daily before a meal.  . predniSONE (DELTASONE) 20 MG tablet 2 tablets daily for 3 days, 1 tablet daily for 4 days.  . TRI-LO-MARZIA 0.18/0.215/0.25 MG-25 MCG tab TAKE ONE TABLET BY MOUTH DAILY  . triamcinolone ointment (KENALOG) 0.5 % Apply 1 application topically 2 (two)  times daily.   No current facility-administered medications on file prior to visit.     ROS: all negative except above.   Physical Exam:  There were no vitals taken for this visit.  General Appearance: Well nourished, in no apparent distress. Eyes: PERRLA, EOMs, conjunctiva no swelling or erythema Sinuses: No Frontal/maxillary tenderness ENT/Mouth: Ext aud canals clear, TMs without erythema, bulging. No erythema, swelling, or exudate on post pharynx.  Tonsils not swollen or erythematous. Hearing normal.  Neck: Supple, thyroid normal.  Respiratory: Respiratory effort normal, BS equal bilaterally without rales, rhonchi, wheezing or stridor.  Cardio: RRR with no MRGs. Brisk peripheral pulses without edema.  Abdomen: Soft, obese abdomen, + BS.  Non tender, no guarding, rebound, hernias, masses. Lymphatics: Non tender without lymphadenopathy.  Musculoskeletal: Full ROM, 5/5 strength, normal gait.  Skin: Warm, dry without rashes, lesions, ecchymosis.  Neuro: Cranial nerves intact. Normal muscle tone, no cerebellar symptoms. Sensation intact.  Psych: Awake and oriented X 3, normal affect, Insight and Judgment appropriate.     Dan Maker, NP 10:32 AM [facility]

## 2019-03-25 ENCOUNTER — Ambulatory Visit: Payer: Self-pay | Admitting: Adult Health

## 2019-03-29 ENCOUNTER — Other Ambulatory Visit: Payer: Self-pay | Admitting: Adult Health

## 2019-03-29 MED ORDER — PREDNISONE 20 MG PO TABS: ORAL_TABLET | ORAL | 0 refills | Status: AC

## 2019-04-10 ENCOUNTER — Other Ambulatory Visit: Payer: Self-pay

## 2019-04-10 ENCOUNTER — Other Ambulatory Visit: Payer: Self-pay | Admitting: Adult Health

## 2019-04-10 DIAGNOSIS — I10 Essential (primary) hypertension: Secondary | ICD-10-CM

## 2019-04-10 MED ORDER — BISOPROLOL-HYDROCHLOROTHIAZIDE 5-6.25 MG PO TABS: ORAL_TABLET | ORAL | 0 refills | Status: DC

## 2019-05-01 ENCOUNTER — Other Ambulatory Visit: Payer: Self-pay | Admitting: Physician Assistant

## 2019-06-15 ENCOUNTER — Other Ambulatory Visit: Payer: Self-pay | Admitting: Internal Medicine

## 2019-06-15 DIAGNOSIS — J0101 Acute recurrent maxillary sinusitis: Secondary | ICD-10-CM

## 2019-07-20 ENCOUNTER — Other Ambulatory Visit: Payer: Self-pay | Admitting: Physician Assistant

## 2019-07-31 ENCOUNTER — Encounter: Payer: Self-pay | Admitting: Adult Health

## 2019-07-31 NOTE — Progress Notes (Signed)
6 month follow up  Assessment and Plan:  Hypertension, unspecified type Continue ziac 5-6.25 mg 1-2 tabs daily for BP goal <130/80 Monitor blood pressure at home; call if consistently over 130/80 Discussed DASH diet Advised to go to the ER if any CP, SOB, nausea, dizziness, severe HA, changes vision/speech, left arm numbness and tingling and jaw pain. -     bisoprolol-hydrochlorothiazide (ZIAC) 5-6.25 MG tablet; Take 2 tabs by mouth daily for blood pressure goal of 130/80.  Morbid obesity (HCC) Long discussion about weight loss, diet, and exercise Recommended diet heavy in fruits and veggies and low in animal meats, cheeses, and dairy products, appropriate calorie intake Patient will work on increasing exercise, watching portions, weigh weekly and keep a log Discussed appropriate weight for height and initial goal (<215lb) Follow up at next visit  Further disposition pending results of labs. Discussed med's effects and SE's.   Over 15 minutes of exam, counseling, chart review, and critical decision making was performed.   No future appointments.  ------------------------------------------------------------------------------------------------------------------   HPI 38 y.o.female , uninsured/cash pay patient, presents for 6 month follow up for morbid obesity and hypertension.   She has recurrent sinus problems, environmental allergies and is on singulair, zyrtec, fluticasone.   Her blood pressure has been controlled at home (consistently 127-130s/70-80s) taking bisoprolol-hctz 10-12.5 mg daily, today their BP is BP: 130/90   She does not workout. She denies chest pain, shortness of breath, dizziness.  BMI is Body mass index is 46.06 kg/m., she has been working on diet, hasn't been exercising but needs to restart as she did much better when she was exercising. She does walk a fair bit at work. She is ambivalent about working on weight.  Wt Readings from Last 3 Encounters:  08/05/19  230 lb (104.3 kg)  01/28/19 215 lb (97.5 kg)  11/02/18 224 lb 12.8 oz (102 kg)   Lab Results  Component Value Date   NA 135 09/14/2018   K 3.9 09/14/2018   CL 101 09/14/2018   CO2 22 09/14/2018   GLUCOSE 77 09/14/2018   BUN 8 09/14/2018   CREATININE 0.65 09/14/2018   CALCIUM 9.5 09/14/2018   GFRAA 131 09/14/2018   GFRNONAA 113 09/14/2018   Lab Results  Component Value Date   TSH 4.37 09/14/2018     Past Medical History:  Diagnosis Date  . Anemia   . Appendicitis 06/22/2011  . Asthma   . GERD (gastroesophageal reflux disease)   . Hyperlipidemia      Allergies  Allergen Reactions  . Aspirin     Stomach pain/ burning   . Avelox [Moxifloxacin Hcl In Nacl]     hives  . Azithromycin     Flu like symptoms  . Ibuprofen     Stomach pain   . Shellfish Allergy     Current Outpatient Medications on File Prior to Visit  Medication Sig  . albuterol (PROVENTIL HFA;VENTOLIN HFA) 108 (90 Base) MCG/ACT inhaler Inhale 2 puffs into the lungs every 2 (two) hours as needed for wheezing or shortness of breath (cough).  . bisoprolol-hydrochlorothiazide (ZIAC) 5-6.25 MG tablet TAKE 1-2 TABLETS BY MOUTH DAILY FOR BLOOD PRESSURE GOAL OF 130/80  . IRON PO Take 45 mg by mouth 2 (two) times daily.    . montelukast (SINGULAIR) 10 MG tablet Take 1 tablet Daily for Allergies  . Norgestimate-Ethinyl Estradiol Triphasic (TRI-LO-MARZIA) 0.18/0.215/0.25 MG-25 MCG tab Take 1 tablet Daily  . omeprazole (PRILOSEC) 20 MG capsule Take 20 mg by mouth daily.   Marland Kitchen  triamcinolone ointment (KENALOG) 0.5 % Apply 1 application topically 2 (two) times daily. (Patient taking differently: Apply 1 application topically as needed. )  . metoCLOPramide (REGLAN) 10 MG tablet Take 1 tablet (10 mg total) by mouth 3 (three) times daily with meals. (Patient taking differently: Take 10 mg by mouth as needed. )   No current facility-administered medications on file prior to visit.     ROS: all negative except above.    Physical Exam:  BP 130/90   Pulse 96   Temp (!) 97.5 F (36.4 C)   Ht 4' 11.25" (1.505 m)   Wt 230 lb (104.3 kg)   SpO2 99%   BMI 46.06 kg/m   General Appearance: Well nourished, in no apparent distress. Eyes: PERRLA, EOMs, conjunctiva no swelling or erythema Sinuses: No Frontal/maxillary tenderness ENT/Mouth: Ext aud canals clear, TMs without erythema, bulging. No erythema, swelling, or exudate on post pharynx.  Tonsils not swollen or erythematous. Hearing normal.  Neck: Supple, thyroid normal.  Respiratory: Respiratory effort normal, BS equal bilaterally without rales, rhonchi, wheezing or stridor.  Cardio: RRR with no MRGs. Brisk peripheral pulses without edema.  Abdomen: Soft, obese abdomen, + BS.  Non tender, no guarding, rebound, hernias, masses. Lymphatics: Non tender without lymphadenopathy.  Musculoskeletal: Full ROM, 5/5 strength, normal gait.  Skin: Warm, dry without rashes, lesions, ecchymosis.  Neuro: Cranial nerves intact. Normal muscle tone, no cerebellar symptoms. Sensation intact.  Psych: Awake and oriented X 3, normal affect, Insight and Judgment appropriate.   Izora Ribas, NP 8:46 AM [facility]

## 2019-08-05 ENCOUNTER — Ambulatory Visit (INDEPENDENT_AMBULATORY_CARE_PROVIDER_SITE_OTHER): Payer: Self-pay | Admitting: Adult Health

## 2019-08-05 ENCOUNTER — Other Ambulatory Visit: Payer: Self-pay

## 2019-08-05 ENCOUNTER — Other Ambulatory Visit: Payer: Self-pay | Admitting: Adult Health

## 2019-08-05 ENCOUNTER — Encounter: Payer: Self-pay | Admitting: Adult Health

## 2019-08-05 VITALS — BP 132/84 | HR 96 | Temp 97.5°F | Ht 59.25 in | Wt 230.0 lb

## 2019-08-05 DIAGNOSIS — I10 Essential (primary) hypertension: Secondary | ICD-10-CM

## 2019-08-05 DIAGNOSIS — E782 Mixed hyperlipidemia: Secondary | ICD-10-CM

## 2019-08-05 MED ORDER — ALBUTEROL SULFATE HFA 108 (90 BASE) MCG/ACT IN AERS: 2.0000 | INHALATION_SPRAY | RESPIRATORY_TRACT | 2 refills | Status: DC | PRN

## 2019-08-05 MED ORDER — ALBUTEROL SULFATE HFA 108 (90 BASE) MCG/ACT IN AERS: 1.0000 | INHALATION_SPRAY | Freq: Four times a day (QID) | RESPIRATORY_TRACT | 2 refills | Status: DC | PRN

## 2019-08-05 NOTE — Patient Instructions (Addendum)
Goals    . Blood Pressure < 130/80    . Exercise 150 min/wk Moderate Activity    . Weight (lb) < 215 lb (97.5 kg)       Try adding famotidine or pepcid in addition to omeprazole for reflux if needed   Weigh weekly and keep a log for general progress; make small sustainable changes; remember diet is 90% of the equation but exercise is also important for general health     Drink 1/2 your body weight in fluid ounces of water daily; drink a tall glass of water 30 min before meals  Don't eat until you're stuffed- listen to your stomach and eat until you are 80% full   Try eating off of a salad plate; wait 10 min after finishing before going back for seconds  Start by eating the vegetables on your plate; aim for 50% of your meals to be fruits or vegetables  Then eat your protein - lean meats (grass fed if possible), fish, beans, nuts in moderation  Eat your carbs/starch last ONLY if you still are hungry. If you can, stop before finishing it all  Avoid sugar and flour - the closer it looks to it's original form in nature, typically the better it is for you  Splurge in moderation - "assign" days when you get to splurge and have the "bad stuff" - I like to follow a 80% - 20% plan- "good" choices 80 % of the time, "bad" choices in moderation 20% of the time  Simple equation is: Calories out > calories in = weight loss - even if you eat the bad stuff, if you limit portions, you will still lose weight    Google mindful eating and here are some tips and tricks below.   Rate your hunger before you eat on a scale of 1-10, try to eat closer to a 6 or higher. And if you are at below that, why are you eating? Slow down and listen to your body.

## 2019-08-05 NOTE — Addendum Note (Signed)
Addended by: Izora Ribas on: 08/05/2019 09:25 AM   Modules accepted: Orders

## 2019-08-06 LAB — COMPLETE METABOLIC PANEL WITH GFR
AG Ratio: 1.6 (calc) (ref 1.0–2.5)
ALT: 26 U/L (ref 6–29)
AST: 20 U/L (ref 10–30)
Albumin: 4.1 g/dL (ref 3.6–5.1)
Alkaline phosphatase (APISO): 65 U/L (ref 31–125)
BUN: 12 mg/dL (ref 7–25)
CO2: 30 mmol/L (ref 20–32)
Calcium: 9.7 mg/dL (ref 8.6–10.2)
Chloride: 99 mmol/L (ref 98–110)
Creat: 0.73 mg/dL (ref 0.50–1.10)
GFR, Est African American: 121 mL/min/{1.73_m2} (ref >=60)
GFR, Est Non African American: 104 mL/min/{1.73_m2} (ref >=60)
Globulin: 2.6 g/dL (calc) (ref 1.9–3.7)
Glucose, Bld: 141 mg/dL — ABNORMAL HIGH (ref 65–99)
Potassium: 3.6 mmol/L (ref 3.5–5.3)
Sodium: 139 mmol/L (ref 135–146)
Total Bilirubin: 0.4 mg/dL (ref 0.2–1.2)
Total Protein: 6.7 g/dL (ref 6.1–8.1)

## 2019-08-07 ENCOUNTER — Other Ambulatory Visit: Payer: Self-pay | Admitting: Adult Health

## 2019-08-07 MED ORDER — ALBUTEROL SULFATE HFA 108 (90 BASE) MCG/ACT IN AERS: 1.0000 | INHALATION_SPRAY | Freq: Four times a day (QID) | RESPIRATORY_TRACT | 2 refills | Status: DC | PRN

## 2019-10-09 ENCOUNTER — Other Ambulatory Visit: Payer: Self-pay | Admitting: Adult Health

## 2019-10-09 DIAGNOSIS — I10 Essential (primary) hypertension: Secondary | ICD-10-CM

## 2019-10-22 ENCOUNTER — Other Ambulatory Visit: Payer: Self-pay

## 2019-10-22 ENCOUNTER — Other Ambulatory Visit: Payer: Self-pay | Admitting: Internal Medicine

## 2020-01-08 ENCOUNTER — Other Ambulatory Visit: Payer: Self-pay | Admitting: Internal Medicine

## 2020-01-08 ENCOUNTER — Other Ambulatory Visit: Payer: Self-pay | Admitting: Adult Health

## 2020-01-08 DIAGNOSIS — I10 Essential (primary) hypertension: Secondary | ICD-10-CM

## 2020-02-03 NOTE — Progress Notes (Deleted)
6 month follow up  Assessment and Plan:  Hypertension, unspecified type Continue ziac 5-6.25 mg 1-2 tabs daily for BP goal <130/80 Monitor blood pressure at home; call if consistently over 130/80 Discussed DASH diet Advised to go to the ER if any CP, SOB, nausea, dizziness, severe HA, changes vision/speech, left arm numbness and tingling and jaw pain. -     bisoprolol-hydrochlorothiazide (ZIAC) 5-6.25 MG tablet; Take 2 tabs by mouth daily for blood pressure goal of 130/80.  Morbid obesity (HCC) Long discussion about weight loss, diet, and exercise Recommended diet heavy in fruits and veggies and low in animal meats, cheeses, and dairy products, appropriate calorie intake Patient will work on increasing exercise, watching portions, weigh weekly and keep a log Discussed appropriate weight for height and initial goal (<215lb) Follow up at next visit  Further disposition pending results of labs. Discussed med's effects and SE's.   Over 15 minutes of exam, counseling, chart review, and critical decision making was performed.   Future Appointments  Date Time Provider Department Center  02/04/2020  8:45 AM Quentin Mulling, PA-C [facility] None    ------------------------------------------------------------------------------------------------------------------   HPI 39 y.o.female , uninsured/cash pay patient, presents for 6 month follow up for morbid obesity and hypertension.   She has recurrent sinus problems, environmental allergies and is on singulair, zyrtec, fluticasone.   Her blood pressure has been controlled at home (consistently 127-130s/70-80s) taking bisoprolol-hctz 10-12.5 mg daily, today their BP is     She does not workout. She denies chest pain, shortness of breath, dizziness.  BMI is There is no height or weight on file to calculate BMI., she has been working on diet, hasn't been exercising but needs to restart as she did much better when she was exercising. She does walk  a fair bit at work. She is ambivalent about working on weight.  Wt Readings from Last 3 Encounters:  08/05/19 230 lb (104.3 kg)  01/28/19 215 lb (97.5 kg)  11/02/18 224 lb 12.8 oz (102 kg)   Lab Results  Component Value Date   NA 139 08/05/2019   K 3.6 08/05/2019   CL 99 08/05/2019   CO2 30 08/05/2019   GLUCOSE 141 (H) 08/05/2019   BUN 12 08/05/2019   CREATININE 0.73 08/05/2019   CALCIUM 9.7 08/05/2019   GFRAA 121 08/05/2019   GFRNONAA 104 08/05/2019   Lab Results  Component Value Date   TSH 4.37 09/14/2018     Past Medical History:  Diagnosis Date  . Anemia   . Appendicitis 06/22/2011  . Asthma   . GERD (gastroesophageal reflux disease)   . Hyperlipidemia      Allergies  Allergen Reactions  . Aspirin     Stomach pain/ burning   . Avelox [Moxifloxacin Hcl In Nacl]     hives  . Azithromycin     Flu like symptoms  . Ibuprofen     Stomach pain   . Shellfish Allergy     Current Outpatient Medications on File Prior to Visit  Medication Sig  . albuterol (PROAIR HFA) 108 (90 Base) MCG/ACT inhaler Inhale 1-2 puffs into the lungs every 6 (six) hours as needed for wheezing or shortness of breath.  . bisoprolol-hydrochlorothiazide (ZIAC) 5-6.25 MG tablet TAKE 1-2 TABLETS BY MOUTH DAILY FOR BLOOD PRESSURE AS DIRECTED  . IRON PO Take 45 mg by mouth 2 (two) times daily.    . montelukast (SINGULAIR) 10 MG tablet Take 1 tablet Daily for Allergies  . omeprazole (PRILOSEC) 20 MG capsule  Take 20 mg by mouth daily.   . TRI-LO-MARZIA 0.18/0.215/0.25 MG-25 MCG tab TAKE ONE TABLET BY MOUTH DAILY  . triamcinolone ointment (KENALOG) 0.5 % Apply 1 application topically 2 (two) times daily. (Patient taking differently: Apply 1 application topically as needed. )   No current facility-administered medications on file prior to visit.    ROS: all negative except above.   Physical Exam:  There were no vitals taken for this visit.  General Appearance: Well nourished, in no apparent  distress. Eyes: PERRLA, EOMs, conjunctiva no swelling or erythema Sinuses: No Frontal/maxillary tenderness ENT/Mouth: Ext aud canals clear, TMs without erythema, bulging. No erythema, swelling, or exudate on post pharynx.  Tonsils not swollen or erythematous. Hearing normal.  Neck: Supple, thyroid normal.  Respiratory: Respiratory effort normal, BS equal bilaterally without rales, rhonchi, wheezing or stridor.  Cardio: RRR with no MRGs. Brisk peripheral pulses without edema.  Abdomen: Soft, obese abdomen, + BS.  Non tender, no guarding, rebound, hernias, masses. Lymphatics: Non tender without lymphadenopathy.  Musculoskeletal: Full ROM, 5/5 strength, normal gait.  Skin: Warm, dry without rashes, lesions, ecchymosis.  Neuro: Cranial nerves intact. Normal muscle tone, no cerebellar symptoms. Sensation intact.  Psych: Awake and oriented X 3, normal affect, Insight and Judgment appropriate.   Vicie Mutters, PA-C 8:54 AM [facility]

## 2020-02-04 ENCOUNTER — Ambulatory Visit: Payer: Self-pay | Admitting: Physician Assistant

## 2020-02-04 ENCOUNTER — Ambulatory Visit: Payer: Self-pay | Admitting: Adult Health

## 2020-03-24 ENCOUNTER — Other Ambulatory Visit: Payer: Self-pay | Admitting: Adult Health

## 2020-04-09 ENCOUNTER — Other Ambulatory Visit: Payer: Self-pay | Admitting: Adult Health

## 2020-04-09 ENCOUNTER — Other Ambulatory Visit: Payer: Self-pay

## 2020-04-09 DIAGNOSIS — I10 Essential (primary) hypertension: Secondary | ICD-10-CM

## 2020-06-20 ENCOUNTER — Other Ambulatory Visit: Payer: Self-pay | Admitting: Internal Medicine

## 2020-06-20 DIAGNOSIS — J0101 Acute recurrent maxillary sinusitis: Secondary | ICD-10-CM

## 2020-07-05 ENCOUNTER — Other Ambulatory Visit: Payer: Self-pay | Admitting: Adult Health

## 2020-07-05 DIAGNOSIS — I10 Essential (primary) hypertension: Secondary | ICD-10-CM

## 2020-08-04 ENCOUNTER — Other Ambulatory Visit: Payer: Self-pay | Admitting: Adult Health

## 2020-08-04 DIAGNOSIS — I10 Essential (primary) hypertension: Secondary | ICD-10-CM

## 2020-09-03 ENCOUNTER — Other Ambulatory Visit: Payer: Self-pay | Admitting: Internal Medicine

## 2020-09-26 ENCOUNTER — Other Ambulatory Visit: Payer: Self-pay | Admitting: Internal Medicine

## 2020-09-26 DIAGNOSIS — J0101 Acute recurrent maxillary sinusitis: Secondary | ICD-10-CM

## 2020-10-05 ENCOUNTER — Other Ambulatory Visit: Payer: Self-pay | Admitting: Adult Health

## 2020-10-05 DIAGNOSIS — I10 Essential (primary) hypertension: Secondary | ICD-10-CM

## 2021-01-02 ENCOUNTER — Other Ambulatory Visit: Payer: Self-pay | Admitting: Internal Medicine

## 2021-01-02 DIAGNOSIS — J0101 Acute recurrent maxillary sinusitis: Secondary | ICD-10-CM

## 2021-01-02 MED ORDER — BISOPROLOL-HYDROCHLOROTHIAZIDE 10-6.25 MG PO TABS: ORAL_TABLET | ORAL | 1 refills | Status: DC

## 2021-01-04 ENCOUNTER — Other Ambulatory Visit: Payer: Self-pay | Admitting: Adult Health

## 2021-01-04 MED ORDER — BISOPROLOL-HYDROCHLOROTHIAZIDE 10-6.25 MG PO TABS: ORAL_TABLET | ORAL | 1 refills | Status: DC

## 2021-01-08 ENCOUNTER — Encounter: Payer: Self-pay | Admitting: Adult Health

## 2021-01-08 ENCOUNTER — Other Ambulatory Visit: Payer: Self-pay

## 2021-01-08 ENCOUNTER — Ambulatory Visit (INDEPENDENT_AMBULATORY_CARE_PROVIDER_SITE_OTHER): Payer: Self-pay | Admitting: Adult Health

## 2021-01-08 VITALS — BP 128/74 | HR 77 | Temp 97.3°F | Wt 239.0 lb

## 2021-01-08 DIAGNOSIS — Z1329 Encounter for screening for other suspected endocrine disorder: Secondary | ICD-10-CM

## 2021-01-08 DIAGNOSIS — Z79899 Other long term (current) drug therapy: Secondary | ICD-10-CM

## 2021-01-08 DIAGNOSIS — E785 Hyperlipidemia, unspecified: Secondary | ICD-10-CM | POA: Insufficient documentation

## 2021-01-08 DIAGNOSIS — I10 Essential (primary) hypertension: Secondary | ICD-10-CM

## 2021-01-08 NOTE — Patient Instructions (Signed)
Goals    . Blood Pressure < 130/80    . Exercise 150 min/wk Moderate Activity    . Weight (lb) < 215 lb (97.5 kg)          Know what a healthy weight is for you (roughly BMI <25) and aim to maintain this  Aim for 7+ servings of fruits and vegetables daily  65-80+ fluid ounces of water or unsweet tea for healthy kidneys  Limit to max 1 drink of alcohol per day; avoid smoking/tobacco  Limit animal fats in diet for cholesterol and heart health - choose grass fed whenever available  Avoid highly processed foods, and foods high in saturated/trans fats  Aim for low stress - take time to unwind and care for your mental health  Aim for 150 min of moderate intensity exercise weekly for heart health, and weights twice weekly for bone health  Aim for 7-9 hours of sleep daily       High-Fiber Eating Plan Fiber, also called dietary fiber, is a type of carbohydrate. It is found foods such as fruits, vegetables, whole grains, and beans. A high-fiber diet can have many health benefits. Your health care provider may recommend a high-fiber diet to help:  Prevent constipation. Fiber can make your bowel movements more regular.  Lower your cholesterol.  Relieve the following conditions: ? Inflammation of veins in the anus (hemorrhoids). ? Inflammation of specific areas of the digestive tract (uncomplicated diverticulosis). ? A problem of the large intestine, also called the colon, that sometimes causes pain and diarrhea (irritable bowel syndrome, or IBS).  Prevent overeating as part of a weight-loss plan.  Prevent heart disease, type 2 diabetes, and certain cancers. What are tips for following this plan? Reading food labels  Check the nutrition facts label on food products for the amount of dietary fiber. Choose foods that have 5 grams of fiber or more per serving.  The goals for recommended daily fiber intake include: ? Men (age 20 or younger): 34-38 g. ? Men (over age 16): 28-34  g. ? Women (age 14 or younger): 25-28 g. ? Women (over age 28): 22-25 g. Your daily fiber goal is _____________ g.   Shopping  Choose whole fruits and vegetables instead of processed forms, such as apple juice or applesauce.  Choose a wide variety of high-fiber foods such as avocados, lentils, oats, and kidney beans.  Read the nutrition facts label of the foods you choose. Be aware of foods with added fiber. These foods often have high sugar and sodium amounts per serving. Cooking  Use whole-grain flour for baking and cooking.  Melfi with brown rice instead of white rice. Meal planning  Start the day with a breakfast that is high in fiber, such as a cereal that contains 5 g of fiber or more per serving.  Eat breads and cereals that are made with whole-grain flour instead of refined flour or white flour.  Eat brown rice, bulgur wheat, or millet instead of white rice.  Use beans in place of meat in soups, salads, and pasta dishes.  Be sure that half of the grains you eat each day are whole grains. General information  You can get the recommended daily intake of dietary fiber by: ? Eating a variety of fruits, vegetables, grains, nuts, and beans. ? Taking a fiber supplement if you are not able to take in enough fiber in your diet. It is better to get fiber through food than from a supplement.  Gradually increase  how much fiber you consume. If you increase your intake of dietary fiber too quickly, you may have bloating, cramping, or gas.  Drink plenty of water to help you digest fiber.  Choose high-fiber snacks, such as berries, raw vegetables, nuts, and popcorn. What foods should I eat? Fruits Berries. Pears. Apples. Oranges. Avocado. Prunes and raisins. Dried figs. Vegetables Sweet potatoes. Spinach. Kale. Artichokes. Cabbage. Broccoli. Cauliflower. Green peas. Carrots. Squash. Grains Whole-grain breads. Multigrain cereal. Oats and oatmeal. Brown rice. Barley. Bulgur wheat.  Millet. Quinoa. Bran muffins. Popcorn. Rye wafer crackers. Meats and other proteins Navy beans, kidney beans, and pinto beans. Soybeans. Split peas. Lentils. Nuts and seeds. Dairy Fiber-fortified yogurt. Beverages Fiber-fortified soy milk. Fiber-fortified orange juice. Other foods Fiber bars. The items listed above may not be a complete list of recommended foods and beverages. Contact a dietitian for more information. What foods should I avoid? Fruits Fruit juice. Cooked, strained fruit. Vegetables Fried potatoes. Canned vegetables. Well-cooked vegetables. Grains White bread. Pasta made with refined flour. White rice. Meats and other proteins Fatty cuts of meat. Fried chicken or fried fish. Dairy Milk. Yogurt. Cream cheese. Sour cream. Fats and oils Butters. Beverages Soft drinks. Other foods Cakes and pastries. The items listed above may not be a complete list of foods and beverages to avoid. Talk with your dietitian about what choices are best for you. Summary  Fiber is a type of carbohydrate. It is found in foods such as fruits, vegetables, whole grains, and beans.  A high-fiber diet has many benefits. It can help to prevent constipation, lower blood cholesterol, aid weight loss, and reduce your risk of heart disease, diabetes, and certain cancers.  Increase your intake of fiber gradually. Increasing fiber too quickly may cause cramping, bloating, and gas. Drink plenty of water while you increase the amount of fiber you consume.  The best sources of fiber include whole fruits and vegetables, whole grains, nuts, seeds, and beans. This information is not intended to replace advice given to you by your health care provider. Make sure you discuss any questions you have with your health care provider. Document Revised: 02/13/2020 Document Reviewed: 02/13/2020 Elsevier Patient Education  2021 [employer].

## 2021-01-08 NOTE — Progress Notes (Signed)
40 month follow up  Assessment and Plan:  Hypertension, unspecified type Continue ziac 10-6.25 mg daily for BP goal <130/80 Monitor blood pressure at home; call if consistently over 130/80 Discussed DASH diet Advised to go to the ER if any CP, SOB, nausea, dizziness, severe HA, changes vision/speech, left arm numbness and tingling and jaw pain. -     bisoprolol-hydrochlorothiazide (ZIAC) 10-6.25 MG tablet; Take 1 tabs by mouth daily for blood pressure goal of 130/80.  Morbid obesity (HCC) Significant weight gain this year;  Long discussion about weight loss, diet, and exercise Recommended diet heavy in fruits and veggies and low in animal meats, cheeses, and dairy products, appropriate calorie intake Patient will work on increasing exercise, watching portions, weigh weekly and keep a log Discussed appropriate weight for height and initial goal (<215lb) Follow up at next visit, 81mo if any lab abnormalities - self pay; shared decision making for some overdue screening tests today - check TSH, CMP (fasting glucose), CBC; A1C discussed but declined, self pay - plan next if fasting glucose elevated - declined lipid recheck; will work on lifestyle   Orders Placed This Encounter  Procedures  . CBC with Differential/Platelet  . COMPLETE METABOLIC PANEL WITH GFR  . TSH    Further disposition pending results of labs. Discussed med's effects and SE's.   Over 30 minutes of exam, counseling, chart review, and critical decision making was performed.   Future Appointments  Date Time Provider Department Center  01/11/2022  9:30 AM Judd Gaudier, NP [facility] None    ------------------------------------------------------------------------------------------------------------------   HPI 40 y.o.female , uninsured/cash pay patient, presents for overdue 40 month follow up for morbid obesity and hypertension. She has recurrent sinus problems, environmental allergies and is on singulair, zyrtec,  fluticasone.   Her blood pressure has been controlled at home (consistently 127-130s/70-80s) taking bisoprolol-hctz 10-12.5 mg daily, today their BP is BP: 128/74   She does not workout. She denies chest pain, shortness of breath, dizziness.  BMI is Body mass index is 47.87 kg/m., she has been working on diet, hasn't been exercising but needs to restart as she did much better when she was exercising. She does walk a fair bit at work. She is receptive to working on weight this year. Discussed at length-  Wt Readings from Last 3 Encounters:  01/08/21 239 lb (108.4 kg)  08/05/19 230 lb (104.3 kg)  01/28/19 215 lb (97.5 kg)   Last BGL was elevated - reports was non-fasting, had [employer] breakfast that day; today reports fasting.  Lab Results  Component Value Date   NA 139 08/05/2019   K 3.6 08/05/2019   CL 99 08/05/2019   CO2 30 08/05/2019   GLUCOSE 141 (H) 08/05/2019   BUN 12 08/05/2019   CREATININE 0.73 08/05/2019   CALCIUM 9.7 08/05/2019   GFRAA 121 08/05/2019   GFRNONAA 104 08/05/2019     Past Medical History:  Diagnosis Date  . Anemia   . Appendicitis 06/22/2011  . Asthma   . GERD (gastroesophageal reflux disease)   . Hyperlipidemia      Allergies  Allergen Reactions  . Aspirin     Stomach pain/ burning   . Avelox [Moxifloxacin Hcl In Nacl]     hives  . Azithromycin     Flu like symptoms  . Ibuprofen     Stomach pain   . Shellfish Allergy     Current Outpatient Medications on File Prior to Visit  Medication Sig  . albuterol (PROAIR HFA) 108 (  90 Base) MCG/ACT inhaler Inhale 1-2 puffs into the lungs every 6 (six) hours as needed for wheezing or shortness of breath.  . bisoprolol-hydrochlorothiazide (ZIAC) 10-6.25 MG tablet Take  1 tablet   every Morning   for BP  . IRON PO Take 45 mg by mouth 2 (two) times daily.  . montelukast (SINGULAIR) 10 MG tablet TAKE ONE TABLET BY MOUTH DAILY FOR ALLERGIES  . omeprazole (PRILOSEC) 20 MG capsule Take 20 mg by mouth daily.    . TRI-LO-MARZIA 0.18/0.215/0.25 MG-25 MCG tab TAKE ONE TABLET BY MOUTH DAILY  . triamcinolone ointment (KENALOG) 0.5 % Apply 1 application topically 2 (two) times daily. (Patient taking differently: Apply 1 application topically as needed.)   No current facility-administered medications on file prior to visit.    ROS: Review of Systems  Constitutional: Negative for malaise/fatigue and weight loss.  HENT: Negative for hearing loss and tinnitus.   Eyes: Negative for blurred vision and double vision.  Respiratory: Negative for cough, shortness of breath and wheezing.   Cardiovascular: Negative for chest pain, palpitations, orthopnea, claudication and leg swelling.  Gastrointestinal: Negative for abdominal pain, blood in stool, constipation, diarrhea, heartburn, melena, nausea and vomiting.  Genitourinary: Negative.   Musculoskeletal: Negative for joint pain and myalgias.  Skin: Negative for rash.  Neurological: Negative for dizziness, tingling, sensory change, weakness and headaches.  Endo/Heme/Allergies: Negative for polydipsia.  Psychiatric/Behavioral: Negative.   All other systems reviewed and are negative.   Physical Exam:  BP 128/74   Pulse 77   Temp (!) 97.3 F (36.3 C)   Wt 239 lb (108.4 kg)   SpO2 99%   BMI 47.87 kg/m   General Appearance: Well nourished, in no apparent distress. Eyes: PERRLA, EOMs, conjunctiva no swelling or erythema Sinuses: No Frontal/maxillary tenderness ENT/Mouth: Ext aud canals clear, TMs without erythema, bulging. No erythema, swelling, or exudate on post pharynx.  Tonsils not swollen or erythematous. Hearing normal.  Neck: Supple, thyroid normal.  Respiratory: Respiratory effort normal, BS equal bilaterally without rales, rhonchi, wheezing or stridor.  Cardio: RRR with no MRGs. Brisk peripheral pulses without edema.  Abdomen: Soft, obese abdomen, + BS.  Non tender, no guarding, rebound, hernias, masses. Lymphatics: Non tender without  lymphadenopathy.  Musculoskeletal: Full ROM, 5/5 strength, normal gait.  Skin: Warm, dry without rashes, lesions, ecchymosis.  Neuro: Cranial nerves intact. Normal muscle tone, no cerebellar symptoms. Sensation intact.  Psych: Awake and oriented X 3, normal affect, Insight and Judgment appropriate.   Dan Maker, NP 12:38 PM [facility]

## 2021-01-09 LAB — CBC WITH DIFFERENTIAL/PLATELET
Absolute Monocytes: 459 cells/uL (ref 200–950)
Basophils Absolute: 41 cells/uL (ref 0–200)
Basophils Relative: 0.5 %
Eosinophils Absolute: 164 cells/uL (ref 15–500)
Eosinophils Relative: 2 %
HCT: 40.4 % (ref 35.0–45.0)
Hemoglobin: 13.5 g/dL (ref 11.7–15.5)
Lymphs Abs: 2977 cells/uL (ref 850–3900)
MCH: 29.9 pg (ref 27.0–33.0)
MCHC: 33.4 g/dL (ref 32.0–36.0)
MCV: 89.6 fL (ref 80.0–100.0)
MPV: 10 fL (ref 7.5–12.5)
Monocytes Relative: 5.6 %
Neutro Abs: 4559 cells/uL (ref 1500–7800)
Neutrophils Relative %: 55.6 %
Platelets: 336 10*3/uL (ref 140–400)
RBC: 4.51 10*6/uL (ref 3.80–5.10)
RDW: 13 % (ref 11.0–15.0)
Total Lymphocyte: 36.3 %
WBC: 8.2 10*3/uL (ref 3.8–10.8)

## 2021-01-09 LAB — COMPLETE METABOLIC PANEL WITH GFR
AG Ratio: 1.5 (calc) (ref 1.0–2.5)
ALT: 41 U/L — ABNORMAL HIGH (ref 6–29)
AST: 30 U/L (ref 10–30)
Albumin: 4 g/dL (ref 3.6–5.1)
Alkaline phosphatase (APISO): 69 U/L (ref 31–125)
BUN: 14 mg/dL (ref 7–25)
CO2: 25 mmol/L (ref 20–32)
Calcium: 9.4 mg/dL (ref 8.6–10.2)
Chloride: 98 mmol/L (ref 98–110)
Creat: 0.67 mg/dL (ref 0.50–1.10)
GFR, Est African American: 128 mL/min/{1.73_m2} (ref >=60)
GFR, Est Non African American: 111 mL/min/{1.73_m2} (ref >=60)
Globulin: 2.7 g/dL (calc) (ref 1.9–3.7)
Glucose, Bld: 88 mg/dL (ref 65–99)
Potassium: 4.4 mmol/L (ref 3.5–5.3)
Sodium: 135 mmol/L (ref 135–146)
Total Bilirubin: 0.4 mg/dL (ref 0.2–1.2)
Total Protein: 6.7 g/dL (ref 6.1–8.1)

## 2021-01-09 LAB — TSH: TSH: 4.57 mIU/L — ABNORMAL HIGH

## 2021-03-01 ENCOUNTER — Other Ambulatory Visit: Payer: Self-pay | Admitting: Internal Medicine

## 2021-03-01 MED ORDER — NORGESTIM-ETH ESTRAD TRIPHASIC 0.18/0.215/0.25 MG-25 MCG PO TABS: 1.0000 | ORAL_TABLET | Freq: Every day | ORAL | 3 refills | Status: DC

## 2021-10-05 ENCOUNTER — Ambulatory Visit (INDEPENDENT_AMBULATORY_CARE_PROVIDER_SITE_OTHER): Payer: Self-pay | Admitting: Adult Health

## 2021-10-05 ENCOUNTER — Encounter: Payer: Self-pay | Admitting: Adult Health

## 2021-10-05 ENCOUNTER — Other Ambulatory Visit: Payer: Self-pay

## 2021-10-05 VITALS — BP 152/88 | HR 94 | Temp 97.7°F | Wt 241.0 lb

## 2021-10-05 DIAGNOSIS — I10 Essential (primary) hypertension: Secondary | ICD-10-CM

## 2021-10-05 DIAGNOSIS — L03311 Cellulitis of abdominal wall: Secondary | ICD-10-CM | POA: Insufficient documentation

## 2021-10-05 MED ORDER — DOXYCYCLINE HYCLATE 100 MG PO CAPS: ORAL_CAPSULE | ORAL | 0 refills | Status: DC

## 2021-10-05 NOTE — Progress Notes (Signed)
Assessment and Plan:  [name] was seen today for acute visit.  Diagnoses and all orders for this visit:  Cellulitis of right abdominal wall Monitor lines, stop heat, can do ice, NSAID/tylenol if needed for pain Given ED precautions if rapidly progressive, fever/chills, nausea, vomiting Expect some improvement in 2-4 days if good abx fit; not cultured due to lack of purulent discharge at time of exam, also cash pay/cost barrier Close follow up in 4-7 days or sooner if needed -     doxycycline (VIBRAMYCIN) 100 MG capsule; Take 1 capsule 2 x/day with food for 14 days.  Hypertension, unspecified type Elevated in office, patient reports well controlled with current regimen prior to current event Continue medication: ziac 10/6.25 Monitor blood pressure at home; call if consistently over 130/80 Continue DASH diet.   Reminder to go to the ER if any CP, SOB, nausea, dizziness, severe HA, changes vision/speech, left arm numbness and tingling and jaw pain.   Further disposition pending results of labs. Discussed med's effects and SE's.   Over 15 minutes of exam, counseling, chart review, and critical decision making was performed.   Future Appointments  Date Time Provider Department Center  10/21/2021  4:00 PM Judd Gaudier, NP [facility] None  01/11/2022  9:30 AM Judd Gaudier, NP [facility] None    ------------------------------------------------------------------------------------------------------------------   HPI BP (!) 160/90    Pulse 94    Temp 97.7 F (36.5 C)    Wt 241 lb (109.3 kg)    SpO2 99%    BMI 48.27 kg/m  40 y.o.female with morbid obesity presents for evaluation of ? Infected bug bites. She is uninsured/cash pay.   She reports noted "bug bite" round red area to left flank and R lower abdomen 5 days ago, never saw insect, no outdoor activity, washed sheets, bed partner hasn't had any similar. She reports has been applying heat for discomfort with benefit, but over the  weekend R abdomen area became much more tender, developed an open area with intermittent clear and purulent discharge, local redness is spreading. She denies recent activities that might expose her to tick bites.    Her blood pressure has been controlled at home (reports consistently 130s/80s with ziac 10/6.25 mg), today their BP is BP: (!) 160/90, similar on recheck  She denies chest pain, shortness of breath, dizziness, HA, vision changes.    Past Medical History:  Diagnosis Date   Anemia    Appendicitis 06/22/2011   Asthma    GERD (gastroesophageal reflux disease)    Hyperlipidemia      Allergies  Allergen Reactions   Aspirin     Stomach pain/ burning    Avelox [Moxifloxacin Hcl In Nacl]     hives   Azithromycin     Flu like symptoms   Ibuprofen     Stomach pain    Shellfish Allergy     Current Outpatient Medications on File Prior to Visit  Medication Sig   albuterol (PROAIR HFA) 108 (90 Base) MCG/ACT inhaler Inhale 1-2 puffs into the lungs every 6 (six) hours as needed for wheezing or shortness of breath.   bisoprolol-hydrochlorothiazide (ZIAC) 10-6.25 MG tablet Take  1 tablet   every Morning   for BP   CINNAMON PO Take by mouth.   IRON PO Take 45 mg by mouth 2 (two) times daily.   montelukast (SINGULAIR) 10 MG tablet TAKE ONE TABLET BY MOUTH DAILY FOR ALLERGIES   Norgestimate-Ethinyl Estradiol Triphasic (TRI-LO-MARZIA) 0.18/0.215/0.25 MG-25 MCG tab Take 1 tablet  by mouth daily.   omeprazole (PRILOSEC) 20 MG capsule Take 20 mg by mouth daily.    triamcinolone ointment (KENALOG) 0.5 % Apply 1 application topically 2 (two) times daily. (Patient taking differently: Apply 1 application topically as needed.)   Zinc 50 MG CAPS Take by mouth.   No current facility-administered medications on file prior to visit.    ROS: all negative except above.   Physical Exam:  BP (!) 160/90    Pulse 94    Temp 97.7 F (36.5 C)    Wt 241 lb (109.3 kg)    SpO2 99%    BMI 48.27 kg/m    General Appearance: Well nourished, in no apparent distress. Eyes: PERRLA, conjunctiva no swelling or erythema ENT/Mouth: mask in place; Hearing normal.  Neck: Supple Respiratory: Respiratory effort normal, BS equal bilaterally without rales, rhonchi, wheezing or stridor.  Cardio: RRR with no MRGs. Brisk peripheral pulses without edema.  Abdomen: Soft, morbidly obese, pendulous, + BS.  Non tender, no guarding. Lymphatics: Non tender without lymphadenopathy.  Musculoskeletal: no obvious deformity; normal gait.  Skin: Warm, dry; L flank with 3 cm area localized erythema, no fluctuance with center scab; R abdomen with thickened tender skin with areas of clearly demarcated erythema; no fluctuance; scant purulent discharge. Approx 14 cm wide by 10 cm high area; see attached photo. No fluctuance, no significant discharge with attempts to express; very tender.  Neuro: Normal muscle tone Psych: Awake and oriented X 3, normal affect, Insight and Judgment appropriate.       Dan Maker, NP 12:04 PM [facility]

## 2021-10-05 NOTE — Patient Instructions (Signed)
Doxycycline Capsules or Tablets ?What is this medication? ?DOXYCYCLINE (dox i SYE kleen) treats infections caused by bacteria. It belongs to a group of medications called tetracycline antibiotics. It will not treat colds, the flu, or infections caused by viruses. ?This medicine may be used for other purposes; ask your health care provider or pharmacist if you have questions. ?COMMON BRAND NAME(S): Acticlate, Adoxa, Adoxa CK, Adoxa Pak, Adoxa TT, Alodox, Avidoxy, Doxal, LYMEPAK, Mondoxyne NL, Monodox, Morgidox 1x, Morgidox 1x Kit, Morgidox 2x, Morgidox 2x Kit, NutriDox, Ocudox, Okebo, Periostat, TARGADOX, Vibra-Tabs, Vibramycin ?What should I tell my care team before I take this medication? ?They need to know if you have any of these conditions: ?Kidney disease ?Liver disease ?Long exposure to sunlight like working outdoors ?Recent stomach surgery ?Stomach or intestine problems such as colitis ?Vision Problems ?Yeast or fungal infection of the mouth or vagina ?An unusual or allergic reaction to doxycycline, tetracycline antibiotics, other medications, foods, dyes, or preservatives ?Pregnant or trying to get pregnant ?Breast-feeding ?How should I use this medication? ?Take this medication by mouth with water. Take it as directed on the prescription label at the same time every day. It is best to take this medication without food, but if it upsets your stomach take it with food. Take all of this medication unless your care team tells you to stop it early. Keep taking it even if you think you are better. ?Take antacids and products with aluminum, calcium, magnesium, iron, and zinc in them at a different time of day than this medication. Talk to your care team if you have questions. ?Talk to your care team regarding the use of this medication in children. While this medication may be prescribed for selected conditions, precautions do apply. ?Overdosage: If you think you have taken too much of this medicine contact a  poison control center or emergency room at once. ?NOTE: This medicine is only for you. Do not share this medicine with others. ?What if I miss a dose? ?If you miss a dose, take it as soon as you can. If it is almost time for your next dose, take only that dose. Do not take double or extra doses. ?What may interact with this medication? ?Antacids, vitamins, or other products that contain aluminum, calcium, iron, magnesium, or zinc ?Barbiturates ?Birth control pills ?Bismuth subsalicylate ?Carbamazepine ?Methoxyflurane ?Oral retinoids such as acitretin, isotretinoin ?Other antibiotics ?Phenytoin ?Warfarin ?This list may not describe all possible interactions. Give your health care provider a list of all the medicines, herbs, non-prescription drugs, or dietary supplements you use. Also tell them if you smoke, drink alcohol, or use illegal drugs. Some items may interact with your medicine. ?What should I watch for while using this medication? ?Tell your care team if your symptoms do not improve. ?Do not treat diarrhea with over the counter products. Contact your care team if you have diarrhea that lasts more than 2 days or if it is severe and watery. ?Do not take this medication just before going to bed. It may not dissolve properly when you lay down and can cause pain in your throat. Drink plenty of fluids while taking this medication to also help reduce irritation in your throat. ?This medication can make you more sensitive to the sun. Keep out of the sun. If you cannot avoid being in the sun, wear protective clothing and use sunscreen. Do not use sun lamps or tanning beds/booths. ?Birth control pills may not work properly while you are taking this medication. Talk to your   care team about using an extra method of birth control. ?If you are being treated for a sexually transmitted infection, avoid sexual contact until you have finished your treatment. Your sexual partner may also need treatment. ?If you are using this  medication to prevent malaria, you should still protect yourself from contact with mosquitos. Stay in screened-in areas, use mosquito nets, keep your body covered, and use an insect repellent. ?What side effects may I notice from receiving this medication? ?Side effects that you should report to your care team as soon as possible: ?Allergic reactions--skin rash, itching, hives, swelling of the face, lips, tongue, or throat ?Increased pressure around the brain--severe headache, change in vision, blurry vision, nausea, vomiting ?Joint pain ?Pain or trouble swallowing ?Redness, blistering, peeling, or loosening of the skin, including inside the mouth ?Severe diarrhea, fever ?Unusual vaginal discharge, itching, or odor ?Side effects that usually do not require medical attention (report these to your care team if they continue or are bothersome): ?Change in tooth color ?Diarrhea ?Headache ?Heartburn ?Nausea ?This list may not describe all possible side effects. Call your doctor for medical advice about side effects. You may report side effects to FDA at 1-800-FDA-1088. ?Where should I keep my medication? ?Keep out of the reach of children and pets. ?Store at room temperature, below 30 degrees C (86 degrees F). Protect from light. Keep container tightly closed. Throw away any unused medication after the expiration date. Taking this medication after the expiration date can make you seriously ill. ?NOTE: This sheet is a summary. It may not cover all possible information. If you have questions about this medicine, talk to your doctor, pharmacist, or health care provider. ?? 2022 Elsevier/Gold Standard (2020-12-26 00:00:00) ? ?

## 2021-10-07 ENCOUNTER — Ambulatory Visit: Payer: Self-pay | Admitting: Adult Health

## 2021-10-07 ENCOUNTER — Encounter: Payer: Self-pay | Admitting: Adult Health

## 2021-10-19 ENCOUNTER — Other Ambulatory Visit: Payer: Self-pay | Admitting: Adult Health

## 2021-10-19 DIAGNOSIS — L03311 Cellulitis of abdominal wall: Secondary | ICD-10-CM

## 2021-10-19 MED ORDER — DOXYCYCLINE HYCLATE 100 MG PO CAPS: ORAL_CAPSULE | ORAL | 0 refills | Status: DC

## 2021-10-21 ENCOUNTER — Ambulatory Visit: Payer: Self-pay | Admitting: Adult Health

## 2021-11-26 ENCOUNTER — Encounter: Payer: Self-pay | Admitting: Adult Health

## 2021-11-29 ENCOUNTER — Other Ambulatory Visit: Payer: Self-pay

## 2021-11-29 ENCOUNTER — Ambulatory Visit (INDEPENDENT_AMBULATORY_CARE_PROVIDER_SITE_OTHER): Payer: Self-pay | Admitting: Nurse Practitioner

## 2021-11-29 ENCOUNTER — Encounter: Payer: Self-pay | Admitting: Nurse Practitioner

## 2021-11-29 VITALS — BP 128/90 | HR 89 | Temp 97.5°F | Wt 239.2 lb

## 2021-11-29 DIAGNOSIS — R21 Rash and other nonspecific skin eruption: Secondary | ICD-10-CM

## 2021-11-29 DIAGNOSIS — L732 Hidradenitis suppurativa: Secondary | ICD-10-CM

## 2021-11-29 MED ORDER — DOXYCYCLINE HYCLATE 100 MG PO CAPS: ORAL_CAPSULE | ORAL | 0 refills | Status: DC

## 2021-11-29 MED ORDER — TRIAMCINOLONE ACETONIDE 0.5 % EX OINT: 1.0000 "application " | TOPICAL_OINTMENT | CUTANEOUS | 1 refills | Status: DC | PRN

## 2021-11-29 NOTE — Patient Instructions (Signed)
Hidradenitis Suppurativa Hidradenitis suppurativa is a long-term (chronic) skin disease. It is similar to a severe form of acne, but it affects areas of the body where acne would be unusual, especially areas of the body where skin rubs against skin and becomes moist. These include: Underarms. Groin. Genital area. Buttocks. Upper thighs. Breasts. Hidradenitis suppurativa may start out as small lumps or pimples caused by blocked sweat glands or hair follicles. Pimples may develop into deep sores that break open (rupture) and drain pus. Over time, affected areas of skin may thicken and become scarred. This condition is rare and does not spread from person to person (non-contagious). What are the causes? The exact cause of this condition is not known. It may be related to: Female and female hormones. An overactive disease-fighting system (immune system). The immune system may over-react to blocked hair follicles or sweat glands and cause swelling and pus-filled sores. What increases the risk? You are more likely to develop this condition if you: Are female. Are 11-55 years old. Have a family history of hidradenitis suppurativa. Have a personal history of acne. Are overweight. Smoke. Take the medicine lithium. What are the signs or symptoms? The first symptoms are usually painful bumps in the skin, similar to pimples. The condition may get worse over time (progress), or it may only cause mild symptoms. If the disease progresses, symptoms may include: Skin bumps getting bigger and growing deeper into the skin. Bumps rupturing and draining pus. Itchy, infected skin. Skin getting thicker and scarred. Tunnels under the skin (fistulas) where pus drains from a bump. Pain during daily activities, such as pain during walking if your groin area is affected. Emotional problems, such as stress or depression. This condition may affect your appearance and your ability or willingness to wear certain clothes  or do certain activities. How is this diagnosed? This condition is diagnosed by a health care provider who specializes in skin diseases (dermatologist). You may be diagnosed based on: Your symptoms and medical history. A physical exam. Testing a pus sample for infection. Blood tests. How is this treated? Your treatment will depend on how severe your symptoms are. The same treatment will not work for everybody with this condition. You may need to try several treatments to find what works best for you. Treatment may include: Cleaning and bandaging (dressing) your wounds as needed. Lifestyle changes, such as new skin care routines. Taking medicines, such as: Antibiotics. Acne medicines. Medicines to reduce the activity of the immune system. A diabetes medicine (metformin). Birth control pills, for women. Steroids to reduce swelling and pain. Working with a mental health care provider, if you experience emotional distress due to this condition. If you have severe symptoms that do not get better with medicine, you may need surgery. Surgery may involve: Using a laser to clear the skin and remove hair follicles. Opening and draining deep sores. Removing the areas of skin that are diseased and scarred. Follow these instructions at home: Medicines  Take over-the-counter and prescription medicines only as told by your health care provider. If you were prescribed an antibiotic medicine, take it as told by your health care provider. Do not stop taking the antibiotic even if your condition improves. Skin care If you have open wounds, cover them with a clean dressing as told by your health care provider. Keep wounds clean by washing them gently with soap and water when you bathe. Do not shave the areas where you get hidradenitis suppurativa. Do not wear deodorant. Wear loose-fitting   clothes. Try to avoid getting overheated or sweaty. If you get sweaty or wet, change into clean, dry clothes as soon  as you can. To help relieve pain and itchiness, cover sore areas with a warm, clean washcloth (warm compress) for 5-10 minutes as often as needed. If told by your health care provider, take a bleach bath twice a week: Fill your bathtub halfway with water. Pour in  cup of unscented household bleach. Soak in the tub for 5-10 minutes. Only soak from the neck down. Avoid water on your face and hair. Shower to rinse off the bleach from your skin. General instructions Learn as much as you can about your disease so that you have an active role in your treatment. Work closely with your health care provider to find treatments that work for you. If you are overweight, work with your health care provider to lose weight as recommended. Do not use any products that contain nicotine or tobacco, such as cigarettes and e-cigarettes. If you need help quitting, ask your health care provider. If you struggle with living with this condition, talk with your health care provider or work with a mental health care provider as recommended. Keep all follow-up visits as told by your health care provider. This is important. Where to find more information Hidradenitis Suppurativa Foundation[employer] Inc.: https://www.hs-foundation.org/ American Academy of Dermatology: https://www.aad.org Contact a health care provider if you have: A flare-up of hidradenitis suppurativa. A fever or chills. Trouble controlling your symptoms at home. Trouble doing your daily activities because of your symptoms. Trouble dealing with emotional problems related to your condition. Summary Hidradenitis suppurativa is a long-term (chronic) skin disease. It is similar to a severe form of acne, but it affects areas of the body where acne would be unusual. The first symptoms are usually painful bumps in the skin, similar to pimples. The condition may only cause mild symptoms, or it may get worse over time (progress). If you have open wounds, cover them  with a clean dressing as told by your health care provider. Keep wounds clean by washing them gently with soap and water when you bathe. Besides skin care, treatment may include medicines, laser treatment, and surgery. This information is not intended to replace advice given to you by your health care provider. Make sure you discuss any questions you have with your health care provider. Document Revised: 08/04/2020 Document Reviewed: 08/04/2020 Elsevier Patient Education  2022 Elsevier Inc.  

## 2021-11-29 NOTE — Progress Notes (Signed)
Assessment and Plan:  [name] was seen today for acute visit.  Diagnoses and all orders for this visit:  Hidradenitis axillaris -Complete 2 week course of Doxycycline and then will message me to advise if it has resolved -Will begin daily doxycycline of 100mg  for prevention after the initial 2 week course -Notify the office if develops fever/chills, nausea, vomiting or malaise/fatigue -Advised to not shave areas that are involved(underarms), apply warm compresses 3 times a day for several days -Advised to take daily probiotic such as Align - Notify the office if no improvement within 4 days -     doxycycline (VIBRAMYCIN) 100 MG capsule; Take 1 capsule 2 x/day with food for 14 days.  Rash and nonspecific skin eruption Eczema appearing rash area under both arms- apply triamcinolone as needed -     triamcinolone ointment (KENALOG) 0.5 %; Apply 1 application topically as needed.       Further disposition pending results of labs. Discussed med's effects and SE's.   Over 15 minutes of exam, counseling, chart review, and critical decision making was performed.    Future Appointments  Date Time Provider Department Center  01/11/2022  9:30 AM 01/13/2022, NP [facility] None    ------------------------------------------------------------------------------------------------------------------   HPI BP 128/90    Pulse 89    Temp (!) 97.5 F (36.4 C)    Wt 239 lb 3.2 oz (108.5 kg)    SpO2 99%    BMI 47.91 kg/m   41 y.o.female presents for swollen tender area under right arm which has been present for 5 days.  Similar to area that was on her abdomen in December  Past Medical History:  Diagnosis Date   Anemia    Appendicitis 06/22/2011   Asthma    GERD (gastroesophageal reflux disease)    Hyperlipidemia      Allergies  Allergen Reactions   Aspirin     Stomach pain/ burning    Avelox [Moxifloxacin Hcl In Nacl]     hives   Azithromycin     Flu like symptoms   Ibuprofen      Stomach pain    Shellfish Allergy     Current Outpatient Medications on File Prior to Visit  Medication Sig   Acetaminophen (TYLENOL EXTRA STRENGTH PO) Take by mouth.   albuterol (PROAIR HFA) 108 (90 Base) MCG/ACT inhaler Inhale 1-2 puffs into the lungs every 6 (six) hours as needed for wheezing or shortness of breath.   bisoprolol-hydrochlorothiazide (ZIAC) 10-6.25 MG tablet Take  1 tablet   every Morning   for BP   CINNAMON PO Take by mouth.   IRON PO Take 45 mg by mouth 2 (two) times daily.   montelukast (SINGULAIR) 10 MG tablet TAKE ONE TABLET BY MOUTH DAILY FOR ALLERGIES   Norgestimate-Ethinyl Estradiol Triphasic (TRI-LO-MARZIA) 0.18/0.215/0.25 MG-25 MCG tab Take 1 tablet by mouth daily.   omeprazole (PRILOSEC) 20 MG capsule Take 20 mg by mouth daily.    triamcinolone ointment (KENALOG) 0.5 % Apply 1 application topically 2 (two) times daily. (Patient taking differently: Apply 1 application topically as needed.)   Zinc 50 MG CAPS Take by mouth.   doxycycline (VIBRAMYCIN) 100 MG capsule Take 1 capsule 2 x/day with food for 7 days. (Patient not taking: Reported on 11/29/2021)   No current facility-administered medications on file prior to visit.    ROS: all negative except above.   Physical Exam:  BP 128/90    Pulse 89    Temp (!) 97.5 F (36.4 C)  Wt 239 lb 3.2 oz (108.5 kg)    SpO2 99%    BMI 47.91 kg/m   General Appearance: Well nourished, in no apparent distress. Eyes: PERRLA, EOMs, conjunctiva no swelling or erythema Sinuses: No Frontal/maxillary tenderness ENT/Mouth: Ext aud canals clear, TMs without erythema, bulging. No erythema, swelling, or exudate on post pharynx.  Tonsils not swollen or erythematous. Hearing normal.  Neck: Supple, thyroid normal.  Respiratory: Respiratory effort normal, BS equal bilaterally without rales, rhonchi, wheezing or stridor.  Cardio: RRR with no MRGs. Brisk peripheral pulses without edema.  Abdomen: Soft, + BS.  Non tender, no guarding,  rebound, hernias, masses. Lymphatics: Non tender without lymphadenopathy.  Musculoskeletal: Full ROM, 5/5 strength, normal gait.  Skin: Warm, dry . 2 cm erythematous mildly indurated area under left arm, 10 cm firm erythematous tender area under right arm Neuro: Cranial nerves intact. Normal muscle tone, no cerebellar symptoms. Sensation intact.  Psych: Awake and oriented X 3, normal affect, Insight and Judgment appropriate.     Revonda Humphrey, NP 10:05 AM [name]

## 2021-12-13 ENCOUNTER — Other Ambulatory Visit: Payer: Self-pay | Admitting: Nurse Practitioner

## 2021-12-13 ENCOUNTER — Encounter: Payer: Self-pay | Admitting: Nurse Practitioner

## 2021-12-13 DIAGNOSIS — L732 Hidradenitis suppurativa: Secondary | ICD-10-CM

## 2021-12-13 MED ORDER — DOXYCYCLINE HYCLATE 100 MG PO CAPS: 100.0000 mg | ORAL_CAPSULE | Freq: Every day | ORAL | 3 refills | Status: DC

## 2021-12-30 ENCOUNTER — Other Ambulatory Visit: Payer: Self-pay | Admitting: Internal Medicine

## 2021-12-30 ENCOUNTER — Other Ambulatory Visit: Payer: Self-pay

## 2021-12-30 DIAGNOSIS — J0101 Acute recurrent maxillary sinusitis: Secondary | ICD-10-CM

## 2021-12-30 MED ORDER — MONTELUKAST SODIUM 10 MG PO TABS: ORAL_TABLET | ORAL | 3 refills | Status: DC

## 2022-01-02 ENCOUNTER — Encounter: Payer: Self-pay | Admitting: Adult Health

## 2022-01-03 ENCOUNTER — Other Ambulatory Visit: Payer: Self-pay | Admitting: Nurse Practitioner

## 2022-01-03 ENCOUNTER — Encounter: Payer: Self-pay | Admitting: Nurse Practitioner

## 2022-01-03 DIAGNOSIS — R051 Acute cough: Secondary | ICD-10-CM

## 2022-01-03 MED ORDER — ALBUTEROL SULFATE (2.5 MG/3ML) 0.083% IN NEBU: 2.5000 mg | INHALATION_SOLUTION | RESPIRATORY_TRACT | 2 refills | Status: DC | PRN

## 2022-01-03 MED ORDER — IPRATROPIUM-ALBUTEROL 0.5-2.5 (3) MG/3ML IN SOLN: 3.0000 mL | RESPIRATORY_TRACT | 0 refills | Status: DC | PRN

## 2022-01-08 DIAGNOSIS — R7989 Other specified abnormal findings of blood chemistry: Secondary | ICD-10-CM | POA: Insufficient documentation

## 2022-01-08 NOTE — Progress Notes (Deleted)
6 month follow up/ CPE ? ?Assessment and Plan: ? ? ? ?Hypertension, unspecified type ?Continue ziac 10-6.25 mg daily for BP goal <130/80 ?Monitor blood pressure at home; call if consistently over 130/80 ?Discussed DASH diet ?Advised to go to the ER if any CP, SOB, nausea, dizziness, severe HA, changes vision/speech, left arm numbness and tingling and jaw pain. ?-     bisoprolol-hydrochlorothiazide (ZIAC) 10-6.25 MG tablet; Take 1 tabs by mouth daily for blood pressure goal of 130/80. ? ?Hyperlipidemia  ?*** ? ?Morbid obesity (HCC) ?Significant weight gain this year;  ?Long discussion about weight loss, diet, and exercise ?Recommended diet heavy in fruits and veggies and low in animal meats, cheeses, and dairy products, appropriate calorie intake ?Patient will work on increasing exercise, watching portions, weigh weekly and keep a log ?Discussed appropriate weight for height and initial goal (<215lb) ?Follow up at next visit, 41 if any lab abnormalities ?- self pay; shared decision making for some overdue screening tests today ?- check TSH, CMP (fasting glucose), CBC; A1C discussed but declined, self pay - plan next if fasting glucose elevated ?- declined lipid recheck; will work on lifestyle  ? ?No orders of the defined types were placed in this encounter. ? ? ?Further disposition pending results of labs. Discussed med's effects and SE's.   ?Over 30 minutes of exam, counseling, chart review, and critical decision making was performed.  ? ?Future Appointments  ?Date Time Provider Department Center  ?01/11/2022  9:30 AM Judd Gaudier, NP [facility] None  ? ? ?------------------------------------------------------------------------------------------------------------------ ? ? ?HPI ?41 y.o.female , uninsured/cash pay patient, presents for CPE/overdue follow up. She has Recurrent otitis media of both ears; Hypertension; Morbid obesity (HCC); Hyperlipidemia; and Cellulitis of right abdominal wall on their problem  list. ? ? ?*** CPE ? ? ?She has recurrent sinus problems, environmental allergies and is on singulair, zyrtec, fluticasone.  ? ?BMI is There is no height or weight on file to calculate BMI., she has been working on diet, hasn't been exercising but needs to restart as she did much better when she was exercising. She does walk a fair bit at work. She is receptive to working on weight this year. Discussed at length-  ?Wt Readings from Last 3 Encounters:  ?11/29/21 239 lb 3.2 oz (108.5 kg)  ?10/05/21 241 lb (109.3 kg)  ?01/08/21 239 lb (108.4 kg)  ? ?Her blood pressure has been controlled at home (consistently 127-130s/70-80s) taking bisoprolol-hctz 10-12.5 mg daily, today BP is   ? She {DOES_DOES SUP:10315} workout. She denies chest pain, shortness of breath, dizziness. ? ? She {ACTION; IS/IS XYV:85929244} on cholesterol medication and denies myalgias. Her cholesterol {ACTION; IS/IS NOT:21021397} at goal. The cholesterol last visit was:  No results found for: CHOL, HDL, LDLCALC, LDLDIRECT, TRIG, CHOLHDL ? ? She {Has/has not:18111} been working on diet and exercise for prediabetes, and denies {Symptoms; diabetes w/o none:19199}. Last A1C in the office was: No results found for: HGBA1C ? ?Patient is on Vitamin D supplement.   ?No results found for: VD25OH   ? ? ?Lab Results  ?Component Value Date  ? TSH 4.57 (H) 01/08/2021  ? ? ? ?Past Medical History:  ?Diagnosis Date  ? Anemia   ? Appendicitis 06/22/2011  ? Asthma   ? GERD (gastroesophageal reflux disease)   ? Hyperlipidemia   ?  ? ?Allergies  ?Allergen Reactions  ? Aspirin   ?  Stomach pain/ burning   ? Avelox [Moxifloxacin Hcl In Nacl]   ?  hives  ?  Azithromycin   ?  Flu like symptoms  ? Ibuprofen   ?  Stomach pain   ? Shellfish Allergy   ? ? ?Current Outpatient Medications on File Prior to Visit  ?Medication Sig  ? albuterol (PROVENTIL) (2.5 MG/3ML) 0.083% nebulizer solution Take 3 mLs (2.5 mg total) by nebulization every 4 (four) hours as needed for wheezing or  shortness of breath.  ? Acetaminophen (TYLENOL EXTRA STRENGTH PO) Take by mouth.  ? bisoprolol-hydrochlorothiazide (ZIAC) 10-6.25 MG tablet TAKE ONE TABLET BY MOUTH EVERY MORNING FOR BLOOD PRESSURE  ? CINNAMON PO Take by mouth.  ? doxycycline (VIBRAMYCIN) 100 MG capsule Take 1 capsule (100 mg total) by mouth daily.  ? ipratropium-albuterol (DUONEB) 0.5-2.5 (3) MG/3ML SOLN Take 3 mLs by nebulization every 4 (four) hours as needed. Max:6 doses per day  ? IRON PO Take 45 mg by mouth 2 (two) times daily.  ? montelukast (SINGULAIR) 10 MG tablet TAKE ONE TABLET BY MOUTH DAILY FOR ALLERGIES  ? Norgestimate-Ethinyl Estradiol Triphasic (TRI-LO-MARZIA) 0.18/0.215/0.25 MG-25 MCG tab Take 1 tablet by mouth daily.  ? omeprazole (PRILOSEC) 20 MG capsule Take 20 mg by mouth daily.   ? triamcinolone ointment (KENALOG) 0.5 % Apply 1 application topically as needed.  ? Zinc 50 MG CAPS Take by mouth.  ? ?No current facility-administered medications on file prior to visit.  ? ? ?Allergies:  ?Allergies  ?Allergen Reactions  ? Aspirin   ?  Stomach pain/ burning   ? Avelox [Moxifloxacin Hcl In Nacl]   ?  hives  ? Azithromycin   ?  Flu like symptoms  ? Ibuprofen   ?  Stomach pain   ? Shellfish Allergy   ? ?Medical History:  ?has Recurrent otitis media of both ears; Hypertension; Morbid obesity (HCC); Hyperlipidemia; and Cellulitis of right abdominal wall on their problem list. ?Surgical History:  ?She  has a past surgical history that includes Appendectomy (05/24/11). ?Family History:  ?Herfamily history is not on file. ?Social History:  ? reports that she has never smoked. She has never used smokeless tobacco. She reports that she does not drink alcohol and does not use drugs. ? ? ?There is no immunization history on file for this patient. ? ?Health Maintenance  ?Topic Date Due  ? COVID-19 Vaccine (1) Never done  ? HIV Screening  Never done  ? Hepatitis C Screening  Never done  ? TETANUS/TDAP  Never done  ? PAP SMEAR-Modifier  Never done   ? INFLUENZA VACCINE  05/24/2021  ? HPV VACCINES  Aged Out  ? ?*** ? ? ?ROS: Review of Systems  ?Constitutional:  Negative for malaise/fatigue and weight loss.  ?HENT:  Positive for congestion. Negative for hearing loss and tinnitus.   ?Eyes:  Negative for blurred vision and double vision.  ?Respiratory:  Negative for cough, shortness of breath and wheezing.   ?Cardiovascular:  Negative for chest pain, palpitations, orthopnea, claudication and leg swelling.  ?Gastrointestinal:  Negative for abdominal pain, blood in stool, constipation, diarrhea, heartburn, melena, nausea and vomiting.  ?Genitourinary: Negative.   ?Musculoskeletal:  Negative for joint pain and myalgias.  ?Skin:  Negative for rash.  ?Neurological:  Negative for dizziness, tingling, sensory change, weakness and headaches.  ?Endo/Heme/Allergies:  Positive for environmental allergies. Negative for polydipsia.  ?Psychiatric/Behavioral: Negative.    ?All other systems reviewed and are negative. ? ?Physical Exam: ? ?There were no vitals taken for this visit. ? ?General Appearance: Well nourished, in no apparent distress. ?Eyes: PERRLA, EOMs, conjunctiva no swelling or  erythema ?Sinuses: No Frontal/maxillary tenderness ?ENT/Mouth: Ext aud canals clear, TMs without erythema, bulging. No erythema, swelling, or exudate on post pharynx.  Tonsils not swollen or erythematous. Hearing normal.  ?Neck: Supple, thyroid normal.  ?Respiratory: Respiratory effort normal, BS equal bilaterally without rales, rhonchi, wheezing or stridor.  ?Cardio: RRR with no MRGs. Brisk peripheral pulses without edema.  ?Abdomen: Soft, obese abdomen, + BS.  Non tender, no guarding, rebound, hernias, masses. ?Lymphatics: Non tender without lymphadenopathy.  ?Musculoskeletal: Full ROM, 5/5 strength, normal gait.  ?Skin: Warm, dry without rashes, lesions, ecchymosis.  ?Neuro: Cranial nerves intact. Normal muscle tone, no cerebellar symptoms. Sensation intact.  ?Psych: Awake and oriented X 3,  normal affect, Insight and Judgment appropriate.  ? ?Dan MakerAshley C Atul Delucia, NP ?9:49 AM ?[facility] ?

## 2022-01-11 ENCOUNTER — Ambulatory Visit: Payer: Self-pay | Admitting: Adult Health

## 2022-01-11 DIAGNOSIS — Z0001 Encounter for general adult medical examination with abnormal findings: Secondary | ICD-10-CM

## 2022-01-11 DIAGNOSIS — R7989 Other specified abnormal findings of blood chemistry: Secondary | ICD-10-CM

## 2022-01-11 DIAGNOSIS — E785 Hyperlipidemia, unspecified: Secondary | ICD-10-CM

## 2022-01-11 DIAGNOSIS — I10 Essential (primary) hypertension: Secondary | ICD-10-CM

## 2022-01-23 ENCOUNTER — Encounter: Payer: Self-pay | Admitting: Nurse Practitioner

## 2022-01-24 ENCOUNTER — Other Ambulatory Visit: Payer: Self-pay | Admitting: Nurse Practitioner

## 2022-01-24 MED ORDER — DEXAMETHASONE 1 MG PO TABS: ORAL_TABLET | ORAL | 0 refills | Status: DC

## 2022-02-04 NOTE — Progress Notes (Deleted)
6 month follow up/ CPE  Assessment and Plan:    Hypertension, unspecified type Continue ziac 10-6.25 mg daily for BP goal <130/80 Monitor blood pressure at home; call if consistently over 130/80 Discussed DASH diet Advised to go to the ER if any CP, SOB, nausea, dizziness, severe HA, changes vision/speech, left arm numbness and tingling and jaw pain. -     bisoprolol-hydrochlorothiazide (ZIAC) 10-6.25 MG tablet; Take 1 tabs by mouth daily for blood pressure goal of 130/80.  Hyperlipidemia  ***  Morbid obesity (HCC) Significant weight gain this year;  Long discussion about weight loss, diet, and exercise Recommended diet heavy in fruits and veggies and low in animal meats, cheeses, and dairy products, appropriate calorie intake Patient will work on increasing exercise, watching portions, weigh weekly and keep a log Discussed appropriate weight for height and initial goal (<215lb) Follow up at next visit, 15mo if any lab abnormalities - self pay; shared decision making for some overdue screening tests today - check TSH, CMP (fasting glucose), CBC; A1C discussed but declined, self pay - plan next if fasting glucose elevated - declined lipid recheck; will work on lifestyle   No orders of the defined types were placed in this encounter.   Further disposition pending results of labs. Discussed med's effects and SE's.   Over 30 minutes of exam, counseling, chart review, and critical decision making was performed.   Future Appointments  Date Time Provider Department Center  02/08/2022 11:30 AM Judd Gaudier, NP [facility] None    ------------------------------------------------------------------------------------------------------------------   HPI 41 y.o.female , uninsured/cash pay patient, presents for CPE/overdue follow up. She has Recurrent otitis media of both ears; Hypertension; Morbid obesity (HCC); Hyperlipidemia; Cellulitis of right abdominal wall; and Abnormal TSH on their  problem list.   *** CPE   She has recurrent sinus problems, environmental allergies and is on singulair, zyrtec, fluticasone.   BMI is There is no height or weight on file to calculate BMI., she has been working on diet, hasn't been exercising but needs to restart as she did much better when she was exercising. She does walk a fair bit at work. She is receptive to working on weight this year. Discussed at length-  Wt Readings from Last 3 Encounters:  11/29/21 239 lb 3.2 oz (108.5 kg)  10/05/21 241 lb (109.3 kg)  01/08/21 239 lb (108.4 kg)   Her blood pressure has been controlled at home (consistently 127-130s/70-80s) taking bisoprolol-hctz 10-12.5 mg daily, today BP is    She {DOES_DOES EZM:62947} workout. She denies chest pain, shortness of breath, dizziness.   She {ACTION; IS/IS MLY:65035465} on cholesterol medication and denies myalgias. Her cholesterol {ACTION; IS/IS NOT:21021397} at goal. The cholesterol last visit was:  No results found for: CHOL, HDL, LDLCALC, LDLDIRECT, TRIG, CHOLHDL   She {Has/has not:18111} been working on diet and exercise for prediabetes, and denies {Symptoms; diabetes w/o none:19199}. Last A1C in the office was: No results found for: HGBA1C  Patient is on Vitamin D supplement.   No results found for: VD25OH     Lab Results  Component Value Date   TSH 4.57 (H) 01/08/2021     Past Medical History:  Diagnosis Date  . Anemia   . Appendicitis 06/22/2011  . Asthma   . GERD (gastroesophageal reflux disease)   . Hyperlipidemia      Allergies  Allergen Reactions  . Aspirin     Stomach pain/ burning   . Avelox [Moxifloxacin Hcl In Nacl]     hives  .  Azithromycin     Flu like symptoms  . Ibuprofen     Stomach pain   . Shellfish Allergy     Current Outpatient Medications on File Prior to Visit  Medication Sig  . albuterol (PROVENTIL) (2.5 MG/3ML) 0.083% nebulizer solution Take 3 mLs (2.5 mg total) by nebulization every 4 (four) hours as needed  for wheezing or shortness of breath.  . Acetaminophen (TYLENOL EXTRA STRENGTH PO) Take by mouth.  . bisoprolol-hydrochlorothiazide (ZIAC) 10-6.25 MG tablet TAKE ONE TABLET BY MOUTH EVERY MORNING FOR BLOOD PRESSURE  . CINNAMON PO Take by mouth.  . dexamethasone (DECADRON) 1 MG tablet Take 3 tabs for 3 days, 2 tabs for 3 days 1 tab for 5 days. Take with food.  . doxycycline (VIBRAMYCIN) 100 MG capsule Take 1 capsule (100 mg total) by mouth daily.  Marland Kitchen ipratropium-albuterol (DUONEB) 0.5-2.5 (3) MG/3ML SOLN Take 3 mLs by nebulization every 4 (four) hours as needed. Max:6 doses per day  . IRON PO Take 45 mg by mouth 2 (two) times daily.  . montelukast (SINGULAIR) 10 MG tablet TAKE ONE TABLET BY MOUTH DAILY FOR ALLERGIES  . Norgestimate-Ethinyl Estradiol Triphasic (TRI-LO-MARZIA) 0.18/0.215/0.25 MG-25 MCG tab Take 1 tablet by mouth daily.  Marland Kitchen omeprazole (PRILOSEC) 20 MG capsule Take 20 mg by mouth daily.   Marland Kitchen triamcinolone ointment (KENALOG) 0.5 % Apply 1 application topically as needed.  . Zinc 50 MG CAPS Take by mouth.   No current facility-administered medications on file prior to visit.    Allergies:  Allergies  Allergen Reactions  . Aspirin     Stomach pain/ burning   . Avelox [Moxifloxacin Hcl In Nacl]     hives  . Azithromycin     Flu like symptoms  . Ibuprofen     Stomach pain   . Shellfish Allergy    Medical History:  has Recurrent otitis media of both ears; Hypertension; Morbid obesity (HCC); Hyperlipidemia; Cellulitis of right abdominal wall; and Abnormal TSH on their problem list. Surgical History:  She  has a past surgical history that includes Appendectomy (05/24/11). Family History:  Herfamily history is not on file. Social History:   reports that she has never smoked. She has never used smokeless tobacco. She reports that she does not drink alcohol and does not use drugs.   There is no immunization history on file for this patient.  Health Maintenance  Topic Date Due   . COVID-19 Vaccine (1) Never done  . HIV Screening  Never done  . Hepatitis C Screening  Never done  . TETANUS/TDAP  Never done  . PAP SMEAR-Modifier  Never done  . INFLUENZA VACCINE  05/24/2022  . HPV VACCINES  Aged Out   ***   ROS: Review of Systems  Constitutional:  Negative for malaise/fatigue and weight loss.  HENT:  Positive for congestion. Negative for hearing loss and tinnitus.   Eyes:  Negative for blurred vision and double vision.  Respiratory:  Negative for cough, shortness of breath and wheezing.   Cardiovascular:  Negative for chest pain, palpitations, orthopnea, claudication and leg swelling.  Gastrointestinal:  Negative for abdominal pain, blood in stool, constipation, diarrhea, heartburn, melena, nausea and vomiting.  Genitourinary: Negative.   Musculoskeletal:  Negative for joint pain and myalgias.  Skin:  Negative for rash.  Neurological:  Negative for dizziness, tingling, sensory change, weakness and headaches.  Endo/Heme/Allergies:  Positive for environmental allergies. Negative for polydipsia.  Psychiatric/Behavioral: Negative.    All other systems reviewed and are negative.  Physical Exam:  There were no vitals taken for this visit.  General Appearance: Well nourished, in no apparent distress. Eyes: PERRLA, EOMs, conjunctiva no swelling or erythema Sinuses: No Frontal/maxillary tenderness ENT/Mouth: Ext aud canals clear, TMs without erythema, bulging. No erythema, swelling, or exudate on post pharynx.  Tonsils not swollen or erythematous. Hearing normal.  Neck: Supple, thyroid normal.  Respiratory: Respiratory effort normal, BS equal bilaterally without rales, rhonchi, wheezing or stridor.  Cardio: RRR with no MRGs. Brisk peripheral pulses without edema.  Abdomen: Soft, obese abdomen, + BS.  Non tender, no guarding, rebound, hernias, masses. Lymphatics: Non tender without lymphadenopathy.  Musculoskeletal: Full ROM, 5/5 strength, normal gait.  Skin:  Warm, dry without rashes, lesions, ecchymosis.  Neuro: Cranial nerves intact. Normal muscle tone, no cerebellar symptoms. Sensation intact.  Psych: Awake and oriented X 3, normal affect, Insight and Judgment appropriate.   Dan MakerAshley C Brennan Karam, NP 2:27 PM [facility]

## 2022-02-08 ENCOUNTER — Ambulatory Visit: Payer: Self-pay | Admitting: Adult Health

## 2022-02-08 DIAGNOSIS — R7989 Other specified abnormal findings of blood chemistry: Secondary | ICD-10-CM

## 2022-02-08 DIAGNOSIS — E785 Hyperlipidemia, unspecified: Secondary | ICD-10-CM

## 2022-02-08 DIAGNOSIS — I10 Essential (primary) hypertension: Secondary | ICD-10-CM

## 2022-02-10 ENCOUNTER — Other Ambulatory Visit: Payer: Self-pay | Admitting: Internal Medicine

## 2022-02-10 MED ORDER — NORGESTIM-ETH ESTRAD TRIPHASIC 0.18/0.215/0.25 MG-25 MCG PO TABS: 1.0000 | ORAL_TABLET | Freq: Every day | ORAL | 3 refills | Status: DC

## 2022-02-16 NOTE — Progress Notes (Signed)
6 month follow up ? ?Assessment and Plan: ? ?Hypertension, unspecified type ?Continue ziac 10-6.25 mg daily for BP goal <130/80 ?Monitor blood pressure at home; call if consistently over 130/80 ?Discussed DASH diet ?Advised to go to the ER if any CP, SOB, nausea, dizziness, severe HA, changes vision/speech, left arm numbness and tingling and jaw pain. ?-     bisoprolol-hydrochlorothiazide (ZIAC) 10-6.25 MG tablet; Take 1 tabs by mouth daily for blood pressure goal of 130/80. ? ?Morbid obesity (HCC) ?Significant weight gain this year;  ?Long discussion about weight loss, diet, and exercise ?Recommended diet heavy in fruits and veggies and low in animal meats, cheeses, and dairy products, appropriate calorie intake ?Patient will work on increasing exercise, watching portions, weigh weekly and keep a log ?Discussed appropriate weight for height and initial goal (<215lb) ?Follow up at next visit, 20mo if any lab abnormalities ?- self pay; shared decision making for some overdue screening tests today ?- check TSH, CMP (fasting glucose), CBC; A1C discussed but declined, self pay  ?- declined lipid recheck; will work on lifestyle  ? ?Hypothyroidism ?Borderline elevation at last check.  Will recheck today ?-TSH ? ?Medication Management ?Continued ? ?Hidradenitis axillaris ?Continue Doxycycline daily- if rash recurs will stop ? ?Environmental allergies ?Continue Singulair ?Switch from zyrtec to allegra to determine if  this helps more with symptoms ? ?Further disposition pending results of labs. Discussed med's effects and SE's.   ?Over 30 minutes of exam, counseling, chart review, and critical decision making was performed.  ? ?No future appointments. ? ? ?------------------------------------------------------------------------------------------------------------------ ? ? ?HPI ?40 y.o.female , uninsured/cash pay patient, presents for overdue 6 month follow up for morbid obesity and hypertension. ? ? She has recurrent sinus  problems, environmental allergies and is on singulair, zyrtec, fluticasone. She will switch between generic Zyrtec and Claritin- claritin does not help  ? ?Her blood pressure has been controlled at home (consistently 120-130s/70-80s) taking bisoprolol-hctz 10-6.25 mg daily, today their BP is BP: (!) 150/96   ?BP Readings from Last 3 Encounters:  ?02/22/22 (!) 150/96  ?11/29/21 128/90  ?10/05/21 (!) 152/88  ? She does not workout. She denies chest pain, shortness of breath, dizziness. ? ? ?BMI is Body mass index is 49.39 kg/m?., she has not been working on diet and exercise. She has stopped putting sugar in her coffee.  ?Wt Readings from Last 3 Encounters:  ?02/22/22 246 lb 9.6 oz (111.9 kg)  ?11/29/21 239 lb 3.2 oz (108.5 kg)  ?10/05/21 241 lb (109.3 kg)  ? ?She is not currently doing any medication for thyroid will plan to recheck ?Lab Results  ?Component Value Date  ? TSH 4.57 (H) 01/08/2021  ?  ?She has been increasing fiber in her diet which has helped loose stools.  ? ? ?Past Medical History:  ?Diagnosis Date  ? Anemia   ? Appendicitis 06/22/2011  ? Asthma   ? GERD (gastroesophageal reflux disease)   ? Hyperlipidemia   ?  ? ?Allergies  ?Allergen Reactions  ? Aspirin   ?  Stomach pain/ burning   ? Avelox [Moxifloxacin Hcl In Nacl]   ?  hives  ? Azithromycin   ?  Flu like symptoms  ? Ibuprofen   ?  Stomach pain   ? Shellfish Allergy   ? ? ?Current Outpatient Medications on File Prior to Visit  ?Medication Sig  ? Acetaminophen (TYLENOL EXTRA STRENGTH PO) Take by mouth.  ? albuterol (PROVENTIL) (2.5 MG/3ML) 0.083% nebulizer solution Take 3 mLs (2.5 mg total) by  nebulization every 4 (four) hours as needed for wheezing or shortness of breath.  ? bisoprolol-hydrochlorothiazide (ZIAC) 10-6.25 MG tablet TAKE ONE TABLET BY MOUTH EVERY MORNING FOR BLOOD PRESSURE  ? CINNAMON PO Take by mouth.  ? ipratropium-albuterol (DUONEB) 0.5-2.5 (3) MG/3ML SOLN Take 3 mLs by nebulization every 4 (four) hours as needed. Max:6 doses per  day  ? IRON PO Take 45 mg by mouth 2 (two) times daily.  ? montelukast (SINGULAIR) 10 MG tablet TAKE ONE TABLET BY MOUTH DAILY FOR ALLERGIES  ? Norgestimate-Ethinyl Estradiol Triphasic (TRI-LO-MARZIA) 0.18/0.215/0.25 MG-25 MCG tab Take 1 tablet by mouth daily.  ? omeprazole (PRILOSEC) 20 MG capsule Take 20 mg by mouth daily.   ? triamcinolone ointment (KENALOG) 0.5 % Apply 1 application topically as needed.  ? Zinc 50 MG CAPS Take by mouth.  ? ?No current facility-administered medications on file prior to visit.  ? ? ?ROS: Review of Systems  ?Constitutional:  Negative for malaise/fatigue and weight loss.  ?HENT:  Negative for hearing loss and tinnitus.   ?Eyes:  Negative for blurred vision and double vision.  ?Respiratory:  Negative for cough, shortness of breath and wheezing.   ?Cardiovascular:  Negative for chest pain, palpitations, orthopnea, claudication and leg swelling.  ?Gastrointestinal:  Negative for abdominal pain, blood in stool, constipation, diarrhea, heartburn, melena, nausea and vomiting.  ?Genitourinary: Negative.   ?Musculoskeletal:  Negative for joint pain and myalgias.  ?Skin:  Negative for rash.  ?Neurological:  Negative for dizziness, tingling, sensory change, weakness and headaches.  ?Endo/Heme/Allergies:  Negative for polydipsia.  ?Psychiatric/Behavioral: Negative.    ?All other systems reviewed and are negative. ? ?Physical Exam: ? ?BP (!) 150/96   Pulse 95   Temp (!) 97.3 ?F (36.3 ?C)   Wt 246 lb 9.6 oz (111.9 kg)   LMP 02/10/2022   SpO2 95%   BMI 49.39 kg/m?  ? ?General Appearance: Very pleasant morbidly obese female, in no apparent distress. ?Eyes: PERRLA, EOMs, conjunctiva no swelling or erythema ?Sinuses: No Frontal/maxillary tenderness ?ENT/Mouth: Ext aud canals clear, TMs without erythema, bulging. No erythema, swelling, or exudate on post pharynx.  Tonsils not swollen or erythematous. Hearing normal.  ?Neck: Supple, thyroid normal.  ?Respiratory: Respiratory effort normal, BS  equal bilaterally without rales, rhonchi, wheezing or stridor.  ?Cardio: RRR with no MRGs. Brisk peripheral pulses without edema.  ?Abdomen: Soft, obese abdomen, + BS.  Non tender, no guarding, rebound, hernias, masses. ?Lymphatics: Non tender without lymphadenopathy.  ?Musculoskeletal: Full ROM, 5/5 strength, normal gait.  ?Skin: Warm, dry without rashes, lesions, ecchymosis.  ?Neuro: Cranial nerves intact. Normal muscle tone, no cerebellar symptoms. Sensation intact.  ?Psych: Awake and oriented X 3, normal affect, Insight and Judgment appropriate.  ? ?Revonda Humphrey, NP ?9:23 AM ?[facility] ?

## 2022-02-22 ENCOUNTER — Ambulatory Visit (INDEPENDENT_AMBULATORY_CARE_PROVIDER_SITE_OTHER): Payer: Self-pay | Admitting: Nurse Practitioner

## 2022-02-22 ENCOUNTER — Encounter: Payer: Self-pay | Admitting: Nurse Practitioner

## 2022-02-22 DIAGNOSIS — E039 Hypothyroidism, unspecified: Secondary | ICD-10-CM

## 2022-02-22 DIAGNOSIS — L732 Hidradenitis suppurativa: Secondary | ICD-10-CM

## 2022-02-22 DIAGNOSIS — Z9109 Other allergy status, other than to drugs and biological substances: Secondary | ICD-10-CM

## 2022-02-22 DIAGNOSIS — Z79899 Other long term (current) drug therapy: Secondary | ICD-10-CM

## 2022-02-22 DIAGNOSIS — I10 Essential (primary) hypertension: Secondary | ICD-10-CM

## 2022-02-22 MED ORDER — DOXYCYCLINE HYCLATE 100 MG PO CAPS: 100.0000 mg | ORAL_CAPSULE | Freq: Every day | ORAL | 3 refills | Status: DC

## 2022-02-22 MED ORDER — MONTELUKAST SODIUM 10 MG PO TABS: ORAL_TABLET | ORAL | 3 refills | Status: DC

## 2022-02-22 MED ORDER — BISOPROLOL-HYDROCHLOROTHIAZIDE 10-6.25 MG PO TABS: ORAL_TABLET | ORAL | 1 refills | Status: DC

## 2022-02-23 LAB — COMPLETE METABOLIC PANEL WITH GFR
AG Ratio: 1.3 (calc) (ref 1.0–2.5)
ALT: 24 U/L (ref 6–29)
AST: 23 U/L (ref 10–30)
Albumin: 3.9 g/dL (ref 3.6–5.1)
Alkaline phosphatase (APISO): 78 U/L (ref 31–125)
BUN: 14 mg/dL (ref 7–25)
CO2: 28 mmol/L (ref 20–32)
Calcium: 9.5 mg/dL (ref 8.6–10.2)
Chloride: 101 mmol/L (ref 98–110)
Creat: 0.63 mg/dL (ref 0.50–0.99)
Globulin: 3 g/dL (calc) (ref 1.9–3.7)
Glucose, Bld: 92 mg/dL (ref 65–99)
Potassium: 4.5 mmol/L (ref 3.5–5.3)
Sodium: 139 mmol/L (ref 135–146)
Total Bilirubin: 0.4 mg/dL (ref 0.2–1.2)
Total Protein: 6.9 g/dL (ref 6.1–8.1)
eGFR: 115 mL/min/{1.73_m2} (ref >=60)

## 2022-02-23 LAB — CBC WITH DIFFERENTIAL/PLATELET
Absolute Monocytes: 496 cells/uL (ref 200–950)
Basophils Absolute: 44 cells/uL (ref 0–200)
Basophils Relative: 0.5 %
Eosinophils Absolute: 183 cells/uL (ref 15–500)
Eosinophils Relative: 2.1 %
HCT: 40.1 % (ref 35.0–45.0)
Hemoglobin: 13.1 g/dL (ref 11.7–15.5)
Lymphs Abs: 3054 cells/uL (ref 850–3900)
MCH: 29.2 pg (ref 27.0–33.0)
MCHC: 32.7 g/dL (ref 32.0–36.0)
MCV: 89.5 fL (ref 80.0–100.0)
MPV: 9.8 fL (ref 7.5–12.5)
Monocytes Relative: 5.7 %
Neutro Abs: 4924 cells/uL (ref 1500–7800)
Neutrophils Relative %: 56.6 %
Platelets: 379 10*3/uL (ref 140–400)
RBC: 4.48 10*6/uL (ref 3.80–5.10)
RDW: 13.5 % (ref 11.0–15.0)
Total Lymphocyte: 35.1 %
WBC: 8.7 10*3/uL (ref 3.8–10.8)

## 2022-02-23 LAB — TSH: TSH: 3.93 mIU/L

## 2022-04-06 ENCOUNTER — Other Ambulatory Visit: Payer: Self-pay | Admitting: Nurse Practitioner

## 2022-09-10 ENCOUNTER — Other Ambulatory Visit: Payer: Self-pay | Admitting: Nurse Practitioner

## 2022-09-10 DIAGNOSIS — L732 Hidradenitis suppurativa: Secondary | ICD-10-CM

## 2022-12-24 ENCOUNTER — Other Ambulatory Visit: Payer: Self-pay | Admitting: Nurse Practitioner

## 2022-12-24 DIAGNOSIS — I10 Essential (primary) hypertension: Secondary | ICD-10-CM

## 2023-01-13 ENCOUNTER — Other Ambulatory Visit: Payer: Self-pay | Admitting: Nurse Practitioner

## 2023-01-13 DIAGNOSIS — Z9109 Other allergy status, other than to drugs and biological substances: Secondary | ICD-10-CM

## 2023-01-13 MED ORDER — NORGESTIM-ETH ESTRAD TRIPHASIC 0.18/0.215/0.25 MG-25 MCG PO TABS: 1.0000 | ORAL_TABLET | Freq: Every day | ORAL | 0 refills | Status: DC

## 2023-02-03 ENCOUNTER — Other Ambulatory Visit: Payer: Self-pay | Admitting: Nurse Practitioner

## 2023-02-03 DIAGNOSIS — L732 Hidradenitis suppurativa: Secondary | ICD-10-CM

## 2023-03-03 ENCOUNTER — Encounter: Payer: Self-pay | Admitting: Internal Medicine

## 2023-03-04 ENCOUNTER — Other Ambulatory Visit: Payer: Self-pay | Admitting: Internal Medicine

## 2023-03-04 DIAGNOSIS — L732 Hidradenitis suppurativa: Secondary | ICD-10-CM

## 2023-03-04 MED ORDER — DOXYCYCLINE HYCLATE 100 MG PO CAPS: ORAL_CAPSULE | ORAL | 3 refills | Status: DC

## 2023-03-25 ENCOUNTER — Other Ambulatory Visit: Payer: Self-pay | Admitting: Nurse Practitioner

## 2023-03-25 DIAGNOSIS — Z9109 Other allergy status, other than to drugs and biological substances: Secondary | ICD-10-CM

## 2023-04-07 ENCOUNTER — Other Ambulatory Visit: Payer: Self-pay | Admitting: Nurse Practitioner

## 2023-06-20 ENCOUNTER — Other Ambulatory Visit: Payer: Self-pay | Admitting: Nurse Practitioner

## 2023-06-20 DIAGNOSIS — I10 Essential (primary) hypertension: Secondary | ICD-10-CM

## 2023-06-23 ENCOUNTER — Other Ambulatory Visit: Payer: Self-pay | Admitting: Nurse Practitioner

## 2023-06-23 DIAGNOSIS — I10 Essential (primary) hypertension: Secondary | ICD-10-CM

## 2023-06-25 DIAGNOSIS — N83201 Unspecified ovarian cyst, right side: Secondary | ICD-10-CM

## 2023-06-25 HISTORY — DX: Unspecified ovarian cyst, right side: N83.201

## 2023-06-27 ENCOUNTER — Emergency Department: Payer: Self-pay

## 2023-06-27 ENCOUNTER — Emergency Department
Admission: EM | Admit: 2023-06-27 | Discharge: 2023-06-27 | Disposition: A | Discharge status: Home / Self Care | Payer: Self-pay | Attending: Emergency Medicine | Admitting: Emergency Medicine

## 2023-06-27 ENCOUNTER — Other Ambulatory Visit: Payer: Self-pay

## 2023-06-27 ENCOUNTER — Encounter: Payer: Self-pay | Admitting: Emergency Medicine

## 2023-06-27 DIAGNOSIS — K625 Hemorrhage of anus and rectum: Secondary | ICD-10-CM | POA: Insufficient documentation

## 2023-06-27 DIAGNOSIS — I10 Essential (primary) hypertension: Secondary | ICD-10-CM | POA: Insufficient documentation

## 2023-06-27 DIAGNOSIS — N858 Other specified noninflammatory disorders of uterus: Secondary | ICD-10-CM | POA: Insufficient documentation

## 2023-06-27 DIAGNOSIS — N9489 Other specified conditions associated with female genital organs and menstrual cycle: Secondary | ICD-10-CM

## 2023-06-27 LAB — COMPREHENSIVE METABOLIC PANEL
ALT: 47 U/L — ABNORMAL HIGH (ref 0–44)
AST: 50 U/L — ABNORMAL HIGH (ref 15–41)
Albumin: 3.8 g/dL (ref 3.5–5.0)
Alkaline Phosphatase: 65 U/L (ref 38–126)
Anion gap: 12 (ref 5–15)
BUN: 12 mg/dL (ref 6–20)
CO2: 23 mmol/L (ref 22–32)
Calcium: 9.3 mg/dL (ref 8.9–10.3)
Chloride: 101 mmol/L (ref 98–111)
Creatinine, Ser: 0.72 mg/dL (ref 0.44–1.00)
GFR, Estimated: >60 mL/min (ref >60)
Glucose, Bld: 111 mg/dL — ABNORMAL HIGH (ref 70–99)
Potassium: 3.9 mmol/L (ref 3.5–5.1)
Sodium: 136 mmol/L (ref 135–145)
Total Bilirubin: 0.8 mg/dL (ref 0.3–1.2)
Total Protein: 7.7 g/dL (ref 6.5–8.1)

## 2023-06-27 LAB — LIPASE, BLOOD: Lipase: 37 U/L (ref 11–51)

## 2023-06-27 LAB — URINALYSIS, ROUTINE W REFLEX MICROSCOPIC
Bilirubin Urine: NEGATIVE
Glucose, UA: NEGATIVE mg/dL
Ketones, ur: NEGATIVE mg/dL
Leukocytes,Ua: NEGATIVE
Nitrite: NEGATIVE
Protein, ur: NEGATIVE mg/dL
Specific Gravity, Urine: 1.01 (ref 1.005–1.030)
pH: 5.5 (ref 5.0–8.0)

## 2023-06-27 LAB — CBC
HCT: 40.2 % (ref 36.0–46.0)
Hemoglobin: 13.4 g/dL (ref 12.0–15.0)
MCH: 29.8 pg (ref 26.0–34.0)
MCHC: 33.3 g/dL (ref 30.0–36.0)
MCV: 89.5 fL (ref 80.0–100.0)
Platelets: 309 10*3/uL (ref 150–400)
RBC: 4.49 MIL/uL (ref 3.87–5.11)
RDW: 12.6 % (ref 11.5–15.5)
WBC: 8.8 10*3/uL (ref 4.0–10.5)
nRBC: 0 % (ref 0.0–0.2)

## 2023-06-27 LAB — PREGNANCY, URINE: Preg Test, Ur: NEGATIVE

## 2023-06-27 MED ORDER — IOHEXOL 300 MG/ML  SOLN
100.0000 mL | Freq: Once | INTRAMUSCULAR | Status: AC | PRN
  Administered 2023-06-27: 100 mL via INTRAVENOUS

## 2023-06-27 NOTE — ED Notes (Signed)
Back from us

## 2023-06-27 NOTE — ED Provider Notes (Signed)
[facility] Provider Note    Event Date/Time   First MD Initiated Contact with Patient 06/27/23 1000     (approximate)   History   Abdominal Pain   HPI  [name] is a 42 y.o. female with a past medical history of obesity, hyperlipidemia, hypertension who presents today for evaluation of abdominal pain.  Patient reports that this pain began over the last couple of days and worsened yesterday.  She reports it is primarily left-sided and wraps around to the front of her abdomen.  She noticed some blood-streaked stool which she has had in the past with hemorrhoids.  She has never had a colonoscopy before.  She has not had any vaginal discharge or bleeding.  No fevers or chills.  No nausea or vomiting.  No diarrhea.  Patient Active Problem List   Diagnosis Date Noted   Abnormal TSH 01/08/2022   Cellulitis of right abdominal wall 10/05/2021   Hyperlipidemia    Hypertension 09/13/2018   Morbid obesity (HCC) 09/13/2018   Recurrent otitis media of both ears 07/12/2017          Physical Exam   Triage Vital Signs: ED Triage Vitals  Encounter Vitals Group     BP 06/27/23 0952 (!) 157/96     Systolic BP Percentile --      Diastolic BP Percentile --      Pulse Rate 06/27/23 0952 89     Resp 06/27/23 0952 18     Temp 06/27/23 0952 98.2 F (36.8 C)     Temp Source 06/27/23 0952 Oral     SpO2 06/27/23 0952 96 %     Weight 06/27/23 0949 275 lb (124.7 kg)     Height 06/27/23 0949 4\' 11"  (1.499 m)     Head Circumference --      Peak Flow --      Pain Score 06/27/23 0949 7     Pain Loc --      Pain Education --      Exclude from Growth Chart --     Most recent vital signs: Vitals:   06/27/23 0952 06/27/23 1411  BP: (!) 157/96 (!) 140/88  Pulse: 89 80  Resp: 18 16  Temp: 98.2 F (36.8 C) 98 F (36.7 C)  SpO2: 96% 96%    Physical Exam Vitals and nursing note reviewed.  Constitutional:      General: Awake and alert. No acute distress.     Appearance: Normal appearance. The patient is obese  HENT:     Head: Normocephalic and atraumatic.     Mouth: Mucous membranes are moist.  Eyes:     General: PERRL. Normal EOMs        Right eye: No discharge.        Left eye: No discharge.     Conjunctiva/sclera: Conjunctivae normal.  Cardiovascular:     Rate and Rhythm: Normal rate and regular rhythm.     Pulses: Normal pulses.  Pulmonary:     Effort: Pulmonary effort is normal. No respiratory distress.     Breath sounds: Normal breath sounds.  Abdominal:     Abdomen is soft. There is very mild abdominal centrally and LLQ tenderness. No rebound or guarding. No distention. Musculoskeletal:        General: No swelling. Normal range of motion.     Cervical back: Normal range of motion and neck supple.  Skin:    General: Skin is warm and dry.  Capillary Refill: Capillary refill takes less than 2 seconds.     Findings: No rash.  Neurological:     Mental Status: The patient is awake and alert.      ED Results / Procedures / Treatments   Labs (all labs ordered are listed, but only abnormal results are displayed) Labs Reviewed  COMPREHENSIVE METABOLIC PANEL - Abnormal; Notable for the following components:      Result Value   Glucose, Bld 111 (*)    AST 50 (*)    ALT 47 (*)    All other components within normal limits  URINALYSIS, ROUTINE W REFLEX MICROSCOPIC - Abnormal; Notable for the following components:   APPearance HAZY (*)    Hgb urine dipstick TRACE (*)    Bacteria, UA RARE (*)    All other components within normal limits  LIPASE, BLOOD  CBC  PREGNANCY, URINE  POC URINE PREG, ED     EKG     RADIOLOGY I independently reviewed and interpreted imaging and agree with radiologists findings.     PROCEDURES:  Critical Care performed:   Procedures   MEDICATIONS ORDERED IN ED: Medications  iohexol (OMNIPAQUE) 300 MG/ML solution 100 mL (100 mLs Intravenous Contrast Given 06/27/23 1212)     IMPRESSION  / MDM / ASSESSMENT AND PLAN / ED COURSE  I reviewed the triage vital signs and the nursing notes.   Differential diagnosis includes, but is not limited to, diverticulitis, perforation, abscess, UTI, pyelonephritis, ovarian etiology.  Patient is awake and alert, hemodynamically stable and afebrile.  She is very minimally tender and not currently in any acute distress.  She has declined analgesia and antiemetics.  Labs were obtained in triage and are overall reassuring.  I was originally suspicious for diverticulitis given her left lower quadrant pain, therefore CT scan with contrast was obtained.  However, CT reveals a 10 cm right adnexal mass that appears to be consistent with endometrioma versus ovarian neoplasm.  This was discussed with Dr. Feliberto Gottron and Donato Schultz who have requested a pelvic ultrasound for further evaluation.  Dr. Feliberto Gottron will plan to see the patient after the ultrasound.  Patient made aware of all findings.  She was reassessed multiple times throughout her emergency department stay and continued to be comfortable, did not want analgesia or antiemetic therapy.  Patient was passed off to [facility], PA-C pending ultrasound results and final disposition.   Patient's presentation is most consistent with acute presentation with potential threat to life or bodily function.   Clinical Course as of 06/27/23 1512  Tue Jun 27, 2023  1343 Discussed with Donato Schultz who will discuss with Dr. Graceann Congress [JP]  [phone] Dr Feliberto Gottron requesting [place] [JP]    Clinical Course User Index [JP] Wilbert Hayashi, Herb Grays, PA-C     FINAL CLINICAL IMPRESSION(S) / ED DIAGNOSES   Final diagnoses:  Adnexal mass     Rx / DC Orders   ED Discharge Orders     None        Note:  This document was prepared using Dragon voice recognition software and may include unintentional dictation errors.   Keturah Shavers 06/27/23 1512    Jene Every, MD 06/27/23 [phone]

## 2023-06-27 NOTE — Discharge Instructions (Addendum)
Follow-up with Dr. Feliberto Gottron as discussed.  He also intends to refer you to GI medicine for further evaluation.  Take OTC Tylenol as needed for pain relief.  Follow-up with your PCP as necessary.  Return to the ED for any concerning symptoms.

## 2023-06-27 NOTE — ED Triage Notes (Signed)
Pt via POV from home. Pt c/o LLQ abd pain since yesterday. Endorses rectal bleeding, bright red blood that started yesterday. Pt has a hx of hemorrhoids. Pt is A&OX4 and NAD, ambulatory to triage

## 2023-06-27 NOTE — H&P (Signed)
Consult History and Physical   SERVICE: Gynecology [facility]   Patient Name: [name] Patient MRN:   [id number]  CC: Left lower abd pain x 1 day , blood in stool  HPI: [name] is a 42 y.o. No obstetric history on file. with presents to the ED with a one day h/o LLQ pain and BRB in stool . Virgin and never had a pelvic exam .  CTSCAn shows a complex right ovarian mass 10 x 7 cm . She noticed blood mixed in stool yesterday . + hemorrhoid h/o, no specific pelvic pain other than dysmenorrhea .  No family h/o ovarian or colon cancer  Review of Systems: positives in bold GEN:   fevers, chills, weight changes, appetite changes, fatigue, night sweats HEENT:  HA, vision changes, hearing loss, congestion, rhinorrhea, sinus pressure, dysphagia CV:   CP, palpitations PULM:  SOB, cough GI:  abd pain, N/V/D/C, + hematochezia GU:  dysuria, urgency, frequency,+ abdominal pain  MSK:  arthralgias, myalgias, back pain, swelling SKIN:  rashes, color changes, pallor NEURO:  numbness, weakness, tingling, seizures, dizziness, tremors PSYCH:  depression, anxiety, behavioral problems, confusion  HEME/LYMPH:  easy bruising or bleeding ENDO:  heat/cold intolerance  Past Obstetrical History: OB History   No obstetric history on file.     Past Gynecologic History: G0 Past Medical History: Past Medical History:  Diagnosis Date   Anemia    Appendicitis 06/22/2011   Asthma    GERD (gastroesophageal reflux disease)    Hyperlipidemia     Past Surgical History:   Past Surgical History:  Procedure Laterality Date   APPENDECTOMY  05/24/11    Family History:  family history is not on file. No gyn cancers, no colon cancer Social History:  Social History   Socioeconomic History   Marital status: Single    Spouse name: Not on file   Number of children: Not on file   Years of education: Not on file   Highest education level: Not on file  Occupational History   Not on  file  Tobacco Use   Smoking status: Never   Smokeless tobacco: Never  Substance and Sexual Activity   Alcohol use: No   Drug use: No   Sexual activity: Not on file  Other Topics Concern   Not on file  Social History Narrative   Not on file   Social Determinants of Health   Financial Resource Strain: Not on file  Food Insecurity: Not on file  Transportation Needs: Not on file  Physical Activity: Not on file  Stress: Not on file  Social Connections: Not on file  Intimate Partner Violence: Not on file    Home Medications:  Medications reconciled in EPIC  No current facility-administered medications on file prior to encounter.   Current Outpatient Medications on File Prior to Encounter  Medication Sig Dispense Refill   Acetaminophen (TYLENOL EXTRA STRENGTH PO) Take by mouth.     albuterol (PROVENTIL) (2.5 MG/3ML) 0.083% nebulizer solution Take 3 mLs (2.5 mg total) by nebulization every 4 (four) hours as needed for wheezing or shortness of breath. 75 mL 2   bisoprolol-hydrochlorothiazide (ZIAC) 10-6.25 MG tablet TAKE 1 TABLET BY MOUTH EVERY MORNING FOR BLOOD PRESSURE 30 tablet 0   CINNAMON PO Take by mouth.     doxycycline (VIBRAMYCIN) 100 MG capsule Take 1 capsule Daily with Food for Hidradenitis Suppurativa Prophylaxis 30 capsule 3   ipratropium-albuterol (DUONEB) 0.5-2.5 (3) MG/3ML SOLN Take 3 mLs by nebulization every  4 (four) hours as needed. Max:6 doses per day 540 mL 0   IRON PO Take 45 mg by mouth 2 (two) times daily.     montelukast (SINGULAIR) 10 MG tablet TAKE ONE TABLET BY MOUTH DAILY FOR ALLERGIES 90 tablet 3   Norgestimate-Ethinyl Estradiol Triphasic (TRI-LO-MARZIA) 0.18/0.215/0.25 MG-25 MCG tab Take 1 tablet by mouth daily. 84 tablet 0   omeprazole (PRILOSEC) 20 MG capsule Take 20 mg by mouth daily.      triamcinolone ointment (KENALOG) 0.5 % Apply 1 application topically as needed. 15 g 1   Zinc 50 MG CAPS Take by mouth.      Allergies:  Allergies  Allergen  Reactions   Aspirin     Stomach pain/ burning    Avelox [Moxifloxacin Hcl In Nacl]     hives   Azithromycin     Flu like symptoms   Ibuprofen     Stomach pain    Shellfish Allergy     Physical Exam:  Temp:  [98 F (36.7 C)-98.2 F (36.8 C)] 98 F (36.7 C) (09/03 1411) Pulse Rate:  [80-89] 80 (09/03 1411) Resp:  [16-18] 16 (09/03 1411) BP: (140-157)/(88-96) 140/88 (09/03 1411) SpO2:  [96 %] 96 % (09/03 1411) Weight:  [124.7 kg] 124.7 kg (09/03 0949)   General Appearance:  Well developed, well nourished, no acute distress, alert and oriented x3 HEENT:  Normocephalic atraumatic, extraocular movements intact, moist mucous membranes Cardiovascular:  Normal S1/S2, regular rate and rhythm, no murmurs Pulmonary:  clear to auscultation, no wheezes, rales or rhonchi, symmetric air entry, good air exchange Abdomen:  Bowel sounds present, soft, nontender, nondistended, no abnormal masses, no epigastric pain Extremities:  Full range of motion, no pedal edema, 2+ distal pulses, no tenderness Skin:  normal coloration and turgor, no rashes, no suspicious skin lesions noted  Neurologic:  Cranial nerves 2-12 grossly intact, normal muscle tone, strength 5/5 all four extremities Psychiatric:  Normal mood and affect, appropriate, no AH/VH Pelvic: deferred   Labs/Studies:   CBC and Coags:  Lab Results  Component Value Date   WBC 8.8 06/27/2023   NEUTOPHILPCT 56.6 02/22/2022   EOSPCT 2.1 02/22/2022   BASOPCT 0.5 02/22/2022   LYMPHOPCT 14 05/23/2011   HGB 13.4 06/27/2023   HCT 40.2 06/27/2023   MCV 89.5 06/27/2023   PLT 309 06/27/2023   CMP:  Lab Results  Component Value Date   NA 136 06/27/2023   K 3.9 06/27/2023   CL 101 06/27/2023   CO2 23 06/27/2023   BUN 12 06/27/2023   CREATININE 0.72 06/27/2023   CREATININE 0.63 02/22/2022   CREATININE 0.67 01/08/2021   PROT 7.7 06/27/2023   BILITOT 0.8 06/27/2023   ALT 47 (H) 06/27/2023   AST 50 (H) 06/27/2023   ALKPHOS 65 06/27/2023     Other Imaging: CT ABDOMEN PELVIS W CONTRAST  Result Date: 06/27/2023 CLINICAL DATA:  Left lower quadrant pain beginning yesterday. Hematochezia. EXAM: CT ABDOMEN AND PELVIS WITH CONTRAST TECHNIQUE: Multidetector CT imaging of the abdomen and pelvis was performed using the standard protocol following bolus administration of intravenous contrast. RADIATION DOSE REDUCTION: This exam was performed according to the departmental dose-optimization program which includes automated exposure control, adjustment of the mA and/or kV according to patient size and/or use of iterative reconstruction technique. CONTRAST:  OMNIPAQUE IOHEXOL 300 MG/ML  SOLN COMPARISON:  05/24/2011 FINDINGS: Lower Chest: No acute findings. Hepatobiliary: No suspicious hepatic masses identified. Moderate diffuse hepatic steatosis noted. Gallbladder is unremarkable. No evidence of biliary  ductal dilatation. Pancreas:  No mass or inflammatory changes. Spleen: Within normal limits in size and appearance. Adrenals/Urinary Tract: No suspicious masses identified. No evidence of ureteral calculi or hydronephrosis. Stomach/Bowel: No evidence of obstruction, inflammatory process or abnormal fluid collections. Mild sigmoid diverticulosis is noted, without signs of diverticulitis. Vascular/Lymphatic: No pathologically enlarged lymph nodes. No acute vascular findings. Reproductive: Normal size uterus. A complex right adnexal mass is seen which is new since previous study and measures 10.1 x 7.0 cm. This shows several internal areas of high attenuation, likely representing proteinaceous fluid or blood products. Differential diagnosis includes ovarian neoplasm and endometrioma. No evidence of free fluid. Other:  None. Musculoskeletal:  No suspicious bone lesions identified. IMPRESSION: 10.1 cm complex right adnexal mass. Differential diagnosis includes ovarian neoplasm and endometrioma. Surgical resection should be considered; pelvic MRI without and  with contrast could be performed for further characterization. Mild sigmoid diverticulosis, without radiographic evidence of diverticulitis. Moderate hepatic steatosis. Electronically Signed   By: Danae Orleans M.D.   On: 06/27/2023 13:35    TVUS : pending   Assessment / Plan:   Kimoni J POCOCK is a 42 y.o. No obstetric history on file. who presents with LLQ pain and hematochezia U/s shows a multiloculated cyst with calcification , possible dermoid vs endometrioma vs unlikely ovarian ca given age  42. Pt will get a ca-125 test and she will follow up with me in Office at [facility] .  I have discussed probable surgery with her with most likely oophorectomy  She will also need GI consult to assess hematochezia  45 minutes in patient care   Thank you for the opportunity to be involved with this pt's care.

## 2023-06-27 NOTE — ED Notes (Signed)
See triage note  Presents with some left side abd pain  states pain started yesterday and became worse this am   She also noticed some blood in her stool yesterday and again today  Denies any fever or n/v

## 2023-06-27 NOTE — ED Provider Notes (Signed)
----------------------------------------- 6:33 PM on 06/27/2023 -----------------------------------------  Blood pressure 138/84, pulse 88, temperature 98.2 F (36.8 C), temperature source Oral, resp. rate 16, height 4\' 11"  (1.499 m), weight 124.7 kg, SpO2 96%.  Assuming care from [facility], PA-C/NP-C.  In short, [name] is a 42 y.o. female with a chief complaint of Abdominal Pain .  Refer to the original H&P for additional details.  The current plan of care is to await ultrasound results and consult with Dr. Feliberto Gottron for plan of care..  ____________________________________________    ED Results / Procedures / Treatments   Labs (all labs ordered are listed, but only abnormal results are displayed) Labs Reviewed  COMPREHENSIVE METABOLIC PANEL - Abnormal; Notable for the following components:      Result Value   Glucose, Bld 111 (*)    AST 50 (*)    ALT 47 (*)    All other components within normal limits  URINALYSIS, ROUTINE W REFLEX MICROSCOPIC - Abnormal; Notable for the following components:   APPearance HAZY (*)    Hgb urine dipstick TRACE (*)    Bacteria, UA RARE (*)    All other components within normal limits  LIPASE, BLOOD  CBC  PREGNANCY, URINE  CA 125  POC URINE PREG, ED     EKG    RADIOLOGY  I personally viewed and evaluated these images as part of my medical decision making, as well as reviewing the written report by the radiologist.  ED Provider Interpretation: Right adnexal mass complex and cystic in nature  US PELVIC COMPLETE WITH TRANSVAGINAL  Result Date: 06/27/2023 CLINICAL DATA:  Adnexal mass on CT earlier today EXAM: TRANSABDOMINAL AND TRANSVAGINAL ULTRASOUND OF PELVIS TECHNIQUE: Both transabdominal and transvaginal ultrasound examinations of the pelvis were performed. Transabdominal technique was performed for global imaging of the pelvis including uterus, ovaries, adnexal regions, and pelvic cul-de-sac. It was necessary to proceed  with endovaginal exam following the transabdominal exam to visualize the . COMPARISON:  CT earlier today FINDINGS: Uterus Measurements: 6.2 x 3.3 x 3.7 cm = volume: 55 mL. No fibroids or other mass visualized. Endometrium Thickness: 4 mm.  No focal abnormality visualized. Right ovary Complex solid and cystic vascular mass in the right adnexa measuring 10.4 x 7.3 x 9.9 cm. 1.7 cm hyperechoic focus within the mass could represent macroscopic fat. Left ovary Measurements: 3.0 x 2.0 x 3.4 cm = volume: 11 mL. Limited visualization. Normal appearance/no adnexal mass. Other findings No abnormal free fluid. 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 Recommend GYN consult and pelvis MRI wo and w contrast. Note: This recommendation does not apply to premenarchal patients or to those with increased risk (genetic, family history, elevated tumor markers or other high-risk factors) of ovarian cancer. Reference: Radiology 2019 Nov; 293(2):359-371. Electronically Signed   By: Minerva Fester M.D.   On: 06/27/2023 18:22   CT ABDOMEN PELVIS W CONTRAST  Result Date: 06/27/2023 CLINICAL DATA:  Left lower quadrant pain beginning yesterday. Hematochezia. EXAM: CT ABDOMEN AND PELVIS WITH CONTRAST TECHNIQUE: Multidetector CT imaging of the abdomen and pelvis was performed using the standard protocol following bolus administration of intravenous contrast. RADIATION DOSE REDUCTION: This exam was performed according to the departmental dose-optimization program which includes automated exposure control, adjustment of the mA and/or kV according to patient size and/or use of iterative reconstruction technique. CONTRAST:  OMNIPAQUE IOHEXOL 300 MG/ML  SOLN COMPARISON:  05/24/2011 FINDINGS: Lower  Chest: No acute findings. Hepatobiliary: No suspicious hepatic masses identified. Moderate diffuse hepatic steatosis noted. Gallbladder is  unremarkable. No evidence of biliary ductal dilatation. Pancreas:  No mass or inflammatory changes. Spleen: Within normal limits in size and appearance. Adrenals/Urinary Tract: No suspicious masses identified. No evidence of ureteral calculi or hydronephrosis. Stomach/Bowel: No evidence of obstruction, inflammatory process or abnormal fluid collections. Mild sigmoid diverticulosis is noted, without signs of diverticulitis. Vascular/Lymphatic: No pathologically enlarged lymph nodes. No acute vascular findings. Reproductive: Normal size uterus. A complex right adnexal mass is seen which is new since previous study and measures 10.1 x 7.0 cm. This shows several internal areas of high attenuation, likely representing proteinaceous fluid or blood products. Differential diagnosis includes ovarian neoplasm and endometrioma. No evidence of free fluid. Other:  None. Musculoskeletal:  No suspicious bone lesions identified. IMPRESSION: 10.1 cm complex right adnexal mass. Differential diagnosis includes ovarian neoplasm and endometrioma. Surgical resection should be considered; pelvic MRI without and with contrast could be performed for further characterization. Mild sigmoid diverticulosis, without radiographic evidence of diverticulitis. Moderate hepatic steatosis. Electronically Signed   By: Danae Orleans M.D.   On: 06/27/2023 13:35     PROCEDURES:  Critical Care performed: No  Procedures   MEDICATIONS ORDERED IN ED: Medications  iohexol (OMNIPAQUE) 300 MG/ML solution 100 mL (100 mLs Intravenous Contrast Given 06/27/23 1212)     IMPRESSION / MDM / ASSESSMENT AND PLAN / ED COURSE  I reviewed the triage vital signs and the nursing notes.                              Differential diagnosis includes, but is not limited to, ovarian mass, ovarian cyst, tubo-ovarian abscess, ectopic pregnancy, teratoma  Patient's presentation is most consistent with acute complicated illness / injury requiring diagnostic  workup.  Schermerhorn evaluated the patient in the ED after reviewing the ultrasound images.  He discussed plan of care which would include outpatient management and GI referral for further evaluation of her rectal bleeding and LLQ abdominal pain.  Patient's diagnosis is consistent with complex right adnexal mass. Patient will be discharged home with patient to take OTC Tylenol as needed.  Patient declined any offers for prescription/narcotic pain medications.  Patient is to follow up with Schermerhorn in his office as discussed.  Patient is given ED precautions to return to the ED for any worsening or new symptoms.  Clinical Course as of 06/27/23 1833  Tue Jun 27, 2023  1343 Discussed with Donato Schultz who will discuss with Dr. Graceann Congress [JP]  [phone] Dr Feliberto Gottron requesting [place] [JP]    Clinical Course User Index [JP] Poggi, Herb Grays, PA-C    FINAL CLINICAL IMPRESSION(S) / ED DIAGNOSES   Final diagnoses:  Adnexal mass  Rectal bleeding     Rx / DC Orders   ED Discharge Orders     None        Note:  This document was prepared using Dragon voice recognition software and may include unintentional dictation errors.    Lissa Hoard, PA-C 06/27/23 1836    Minna Antis, MD 06/27/23 [phone]

## 2023-06-29 LAB — CA 125: Cancer Antigen (CA) 125: 22.3 U/mL (ref 0.0–38.1)

## 2023-07-03 NOTE — H&P (Signed)
[name] is a 42 y.o. female here for Rf Jenna Poggi,PA- ED follow up .pt here for from ED for LLQ pain and finding on work up of a 10 cm rt ovarian cyst complex    TVUS  at [facility]   Uterus   Measurements: 6.2 x 3.3 x 3.7 cm = volume: 55 mL. No fibroids or  other mass visualized.   Endometrium   Thickness: 4 mm.  No focal abnormality visualized.   Right ovary   Complex solid and cystic vascular mass in the right adnexa measuring  10.4 x 7.3 x 9.9 cm. 1.7 cm hyperechoic focus within the mass could  represent macroscopic fat.   Left ovary   Measurements: 3.0 x 2.0 x 3.4 cm = volume: 11 mL. Limited  visualization. Normal appearance/no adnexal mass.   Other findings   No abnormal free fluid.    Ctscan :  FINDINGS:  Lower Chest: No acute findings.   Hepatobiliary: No suspicious hepatic masses identified. Moderate  diffuse hepatic steatosis noted. Gallbladder is unremarkable. No  evidence of biliary ductal dilatation.   Pancreas:  No mass or inflammatory changes.   Spleen: Within normal limits in size and appearance.   Adrenals/Urinary Tract: No suspicious masses identified. No evidence  of ureteral calculi or hydronephrosis.   Stomach/Bowel: No evidence of obstruction, inflammatory process or  abnormal fluid collections. Mild sigmoid diverticulosis is noted,  without signs of diverticulitis.   Vascular/Lymphatic: No pathologically enlarged lymph nodes. No acute  vascular findings.   Reproductive: Normal size uterus. A complex right adnexal mass is  seen which is new since previous study and measures 10.1 x 7.0 cm.  This shows several internal areas of high attenuation, likely  representing proteinaceous fluid or blood products. Differential  diagnosis includes ovarian neoplasm and endometrioma. No evidence of  free fluid.   Other:  None.   CA125 - wnl      Past Medical History:  has a past medical history of Acid reflux, HTN (hypertension), and Mild asthma  (HHS-HCC).  Past Surgical History:  has a past surgical history that includes appendectomy (10/24/2010). Family History: family history is not on file. Social History:  reports that she does not have a smoking history on file. She has never used smokeless tobacco. She reports that she does not currently use alcohol. She reports that she does not use drugs. OB/GYN History:  OB History       Gravida  0   Para  0   Term  0   Preterm  0   AB  0   Living  0        SAB  0   IAB  0   Ectopic  0   Molar  0   Multiple  0   Live Births  0             Allergies: is allergic to shellfish containing products, aleve [naproxen sodium], azithromycin, and ibuprofen. Medications:  Current Medications    Current Outpatient Medications:    bisoproloL-hydroCHLOROthiazide (ZIAC) 10-6.25 mg tablet, Take 1 tablet by mouth every morning, Disp: , Rfl:    cetirizine (ZYRTEC) 10 MG tablet, Take 10 mg by mouth once daily, Disp: , Rfl:    doxycycline (VIBRAMYCIN) 100 MG capsule, Take 100 mg by mouth 2 (two) times daily, Disp: , Rfl:    ferrous sulfate 325 (65 FE) MG tablet, Take 160 mg by mouth daily with breakfast, Disp: , Rfl:    ipratropium-albuteroL (DUO-NEB)  nebulizer solution, Take 3 mLs by nebulization 4 (four) times daily as needed, Disp: , Rfl:    montelukast (SINGULAIR) 10 mg tablet, Take 10 mg by mouth at bedtime, Disp: , Rfl:    norgestimate-ethinyl estradiol triphasic (ORTHO TRI-CYCLEN LO) 0.18/0.215/0.25 mg-25 mcg tablet, Take 1 tablet by mouth once daily, Disp: , Rfl:    omeprazole (PRILOSEC) 20 MG DR capsule, Take 20 mg by mouth once daily, Disp: , Rfl:      Review of Systems: General:                      No fatigue or weight loss Eyes:                           No vision changes Ears:                            No hearing difficulty Respiratory:                No cough or shortness of breath Pulmonary:                  No asthma or shortness of breath Cardiovascular:            No chest pain, palpitations, dyspnea on exertion Gastrointestinal:          No abdominal bloating, chronic diarrhea, constipations, masses, pain or hematochezia Genitourinary:             No hematuria, dysuria, abnormal vaginal discharge, pelvic pain, Menometrorrhagia, left pelvic pain  Lymphatic:                   No swollen lymph nodes Musculoskeletal:No muscle weakness Neurologic:                  No extremity weakness, syncope, seizure disorder Psychiatric:                  No history of depression, delusions or suicidal/homicidal ideation      Exam:       Vitals:    06/29/23 1616  BP: 134/86  Pulse: 93      Body mass index is 53.32 kg/m.   WDWN white/ female in NAD   Lungs: CTA  CV : RRR without murmur     Neck:  no thyromegaly Abdomen: soft , no mass, normal active bowel sounds,  non-tender, no rebound tenderness Pelvic: tanner stage 5 ,  External genitalia: vulva /labia no lesions Urethra: no prolapse Vagina: normal physiologic d/c Cervix: no lesions, no cervical motion tenderness   Uterus: normal size shape and contour, non-tender Adnexa: no mass,  non-tender   Rectovaginal:  Impression:    The encounter diagnosis was Right ovarian cyst.   Complex , possible dermoid , endometrioma  Etiology of pain on left uncertain    Plan:    Spoke to her about the need for surgery . Risk of ovarian torsion if no surgery done. Recommend L/S right oophorectomy. Benefits and risks to surgery: The proposed benefit of the surgery has been discussed with the patient. The possible risks include, but are not limited to: organ injury to the bowel , bladder, ureters, and major blood vessels and nerves. There is a possibility of additional surgeries resulting from these injuries. There is also the risk of blood transfusion and the need to receive blood products during or after  the procedure which may rarely lead to HIV or Hepatitis C infection. There is a risk of developing a  deep venous thrombosis or a pulmonary embolism . There is the possibility of wound infection and also anesthetic complications, even the rare possibility of death. The patient understands these risks and wishes to proceed. All questions have been answered and the consent has been signed.          No follow-ups on file.   Vilma Prader, MD

## 2023-07-04 ENCOUNTER — Encounter
Admission: RE | Admit: 2023-07-04 | Discharge: 2023-07-04 | Disposition: A | Discharge status: Home / Self Care | Payer: Self-pay | Source: Ambulatory Visit | Attending: Obstetrics and Gynecology | Admitting: Obstetrics and Gynecology

## 2023-07-04 VITALS — Ht 59.0 in | Wt 275.0 lb

## 2023-07-04 DIAGNOSIS — Z01812 Encounter for preprocedural laboratory examination: Secondary | ICD-10-CM

## 2023-07-04 DIAGNOSIS — I10 Essential (primary) hypertension: Secondary | ICD-10-CM

## 2023-07-04 HISTORY — DX: Hidradenitis suppurativa: L73.2

## 2023-07-04 HISTORY — DX: Morbid (severe) obesity due to excess calories: E66.01

## 2023-07-04 HISTORY — DX: Other allergy status, other than to drugs and biological substances: Z91.09

## 2023-07-04 HISTORY — DX: Hypothyroidism, unspecified: E03.9

## 2023-07-04 HISTORY — DX: Essential (primary) hypertension: I10

## 2023-07-04 NOTE — Patient Instructions (Addendum)
Your procedure is scheduled on: Thursday, September 12 Report to the Registration Desk on the 1st floor of the [facility] To find out your arrival time, please call [phone] between 1PM - 3PM on: Wednesday, September 11 If your arrival time is 6:00 am, do not arrive before that time as the [facility] entrance doors do not open until 6:00 am.  REMEMBER: Instructions that are not followed completely may result in serious medical risk, up to and including death; or upon the discretion of your surgeon and anesthesiologist your surgery may need to be rescheduled.  Do not eat food after midnight the night before surgery.  No gum chewing or hard candies.  You may however, drink CLEAR liquids up to 2 hours before you are scheduled to arrive for your surgery. Do not drink anything within 2 hours of your scheduled arrival time.  Clear liquids include: - water  - apple juice without pulp - gatorade (not RED colors) - black coffee or tea (Do NOT add milk or creamers to the coffee or tea) Do NOT drink anything that is not on this list.  In addition, your doctor has ordered for you to drink the provided:  Ensure Pre-Surgery Clear Carbohydrate Drink  Drinking this carbohydrate drink up to two hours before surgery helps to reduce insulin resistance and improve patient outcomes. Please complete drinking 2 hours before scheduled arrival time.  One week prior to surgery: starting today, September 10 Stop Anti-inflammatories (NSAIDS) such as Advil, Aleve, Ibuprofen, Motrin, Naproxen, Naprosyn and Aspirin based products such as Excedrin, Goody's Powder, BC Powder. Stop ANY OVER THE COUNTER supplements until after surgery. Stop biotin, cinnamon, multiple vitamin, probiotic You may however, continue to take Tylenol if needed for pain up until the day of surgery.  Continue taking all prescribed medications   TAKE ONLY THESE MEDICATIONS THE MORNING OF SURGERY WITH A SIP OF WATER:  Omeprazole  (Prilosec) - (take one the night before and one on the morning of surgery - helps to prevent nausea after surgery.) Albuterol nebulizer  Bring your albuterol inhaler to the hospital.  No Alcohol for 24 hours before or after surgery.  No Smoking including e-cigarettes for 24 hours before surgery.  No chewable tobacco products for at least 6 hours before surgery.  No nicotine patches on the day of surgery.  Do not use any "recreational" drugs for at least a week (preferably 2 weeks) before your surgery.  Please be advised that the combination of cocaine and anesthesia may have negative outcomes, up to and including death. If you test positive for cocaine, your surgery will be cancelled.  On the morning of surgery brush your teeth with toothpaste and water, you may rinse your mouth with mouthwash if you wish. Do not swallow any toothpaste or mouthwash.  Use CHG Soap as directed on instruction sheet.  Do not wear jewelry, make-up, hairpins, clips or nail polish.  For welded (permanent) jewelry: bracelets, anklets, waist bands, etc.  Please have this removed prior to surgery.  If it is not removed, there is a chance that hospital personnel will need to cut it off on the day of surgery.  Do not wear lotions, powders, or perfumes.   Do not shave body hair from the neck down 48 hours before surgery.  Contact lenses, hearing aids and dentures may not be worn into surgery.  Do not bring valuables to the hospital. [facility] is not responsible for any missing/lost belongings or valuables.   Notify your  doctor if there is any change in your medical condition (cold, fever, infection).  Wear comfortable clothing (specific to your surgery type) to the hospital.  After surgery, you can help prevent lung complications by doing breathing exercises.  Take deep breaths and cough every 1-2 hours. Your doctor may order a device called an Incentive Spirometer to help you take deep breaths. When  coughing or sneezing, hold a pillow firmly against your incision with both hands. This is called "splinting." Doing this helps protect your incision. It also decreases belly discomfort.  If you are being discharged the day of surgery, you will not be allowed to drive home. You will need a responsible individual to drive you home and stay with you for 24 hours after surgery.   If you are taking public transportation, you will need to have a responsible individual with you.  Please call the Pre-admissions Testing Dept. at [phone] if you have any questions about these instructions.  Surgery Visitation Policy:  Patients having surgery or a procedure may have two visitors.  Children under the age of 83 must have an adult with them who is not the patient.     Preparing for Surgery with CHLORHEXIDINE GLUCONATE (CHG) Soap  Chlorhexidine Gluconate (CHG) Soap  o An antiseptic cleaner that kills germs and bonds with the skin to continue killing germs even after washing  o Used for showering the night before surgery and morning of surgery  Before surgery, you can play an important role by reducing the number of germs on your skin.  CHG (Chlorhexidine gluconate) soap is an antiseptic cleanser which kills germs and bonds with the skin to continue killing germs even after washing.  Please do not use if you have an allergy to CHG or antibacterial soaps. If your skin becomes reddened/irritated stop using the CHG.  1. Shower the NIGHT BEFORE SURGERY and the MORNING OF SURGERY with CHG soap.  2. If you choose to wash your hair, wash your hair first as usual with your normal shampoo.  3. After shampooing, rinse your hair and body thoroughly to remove the shampoo.  4. Use CHG as you would any other liquid soap. You can apply CHG directly to the skin and wash gently with a scrungie or a clean washcloth.  5. Apply the CHG soap to your body only from the neck down. Do not use on open wounds or  open sores. Avoid contact with your eyes, ears, mouth, and genitals (private parts). Wash face and genitals (private parts) with your normal soap.  6. Wash thoroughly, paying special attention to the area where your surgery will be performed.  7. Thoroughly rinse your body with warm water.  8. Do not shower/wash with your normal soap after using and rinsing off the CHG soap.  9. Pat yourself dry with a clean towel.  10. Wear clean pajamas to bed the night before surgery.  12. Place clean sheets on your bed the night of your first shower and do not sleep with pets.  13. Shower again with the CHG soap on the day of surgery prior to arriving at the hospital.  14. Do not apply any deodorants/lotions/powders.  15. Please wear clean clothes to the hospital.

## 2023-07-05 ENCOUNTER — Encounter
Admission: RE | Admit: 2023-07-05 | Discharge: 2023-07-05 | Disposition: A | Discharge status: Home / Self Care | Payer: Self-pay | Source: Ambulatory Visit | Attending: Obstetrics and Gynecology | Admitting: Obstetrics and Gynecology

## 2023-07-05 ENCOUNTER — Other Ambulatory Visit: Payer: Self-pay

## 2023-07-05 DIAGNOSIS — I10 Essential (primary) hypertension: Secondary | ICD-10-CM | POA: Insufficient documentation

## 2023-07-05 DIAGNOSIS — Z01812 Encounter for preprocedural laboratory examination: Secondary | ICD-10-CM

## 2023-07-05 DIAGNOSIS — L732 Hidradenitis suppurativa: Secondary | ICD-10-CM

## 2023-07-05 DIAGNOSIS — Z01818 Encounter for other preprocedural examination: Secondary | ICD-10-CM | POA: Insufficient documentation

## 2023-07-05 LAB — CBC
HCT: 41 % (ref 36.0–46.0)
Hemoglobin: 13.8 g/dL (ref 12.0–15.0)
MCH: 29.9 pg (ref 26.0–34.0)
MCHC: 33.7 g/dL (ref 30.0–36.0)
MCV: 88.9 fL (ref 80.0–100.0)
Platelets: 305 10*3/uL (ref 150–400)
RBC: 4.61 MIL/uL (ref 3.87–5.11)
RDW: 12.3 % (ref 11.5–15.5)
WBC: 6.4 10*3/uL (ref 4.0–10.5)
nRBC: 0 % (ref 0.0–0.2)

## 2023-07-05 LAB — TYPE AND SCREEN
ABO/RH(D): O POS
Antibody Screen: NEGATIVE

## 2023-07-05 LAB — BASIC METABOLIC PANEL
Anion gap: 14 (ref 5–15)
BUN: 12 mg/dL (ref 6–20)
CO2: 22 mmol/L (ref 22–32)
Calcium: 9.1 mg/dL (ref 8.9–10.3)
Chloride: 100 mmol/L (ref 98–111)
Creatinine, Ser: 0.6 mg/dL (ref 0.44–1.00)
GFR, Estimated: >60 mL/min (ref >60)
Glucose, Bld: 100 mg/dL — ABNORMAL HIGH (ref 70–99)
Potassium: 3.5 mmol/L (ref 3.5–5.1)
Sodium: 136 mmol/L (ref 135–145)

## 2023-07-05 MED ORDER — GABAPENTIN 300 MG PO CAPS
300.0000 mg | ORAL_CAPSULE | ORAL | Status: AC
  Administered 2023-07-06: 300 mg via ORAL

## 2023-07-05 MED ORDER — LACTATED RINGERS IV SOLN: INTRAVENOUS | Status: DC

## 2023-07-05 MED ORDER — CHLORHEXIDINE GLUCONATE 0.12 % MT SOLN
15.0000 mL | Freq: Once | OROMUCOSAL | Status: AC
  Administered 2023-07-06: 15 mL via OROMUCOSAL

## 2023-07-05 MED ORDER — ACETAMINOPHEN 500 MG PO TABS
1000.0000 mg | ORAL_TABLET | ORAL | Status: AC
  Administered 2023-07-06: 1000 mg via ORAL

## 2023-07-05 MED ORDER — DOXYCYCLINE HYCLATE 100 MG PO CAPS: ORAL_CAPSULE | ORAL | 3 refills | Status: DC

## 2023-07-05 MED ORDER — ORAL CARE MOUTH RINSE: 15.0000 mL | Freq: Once | OROMUCOSAL | Status: AC

## 2023-07-05 MED ORDER — POVIDONE-IODINE 10 % EX SWAB: 2.0000 | Freq: Once | CUTANEOUS | Status: DC

## 2023-07-06 ENCOUNTER — Other Ambulatory Visit: Payer: Self-pay

## 2023-07-06 ENCOUNTER — Encounter: Payer: Self-pay | Admitting: Obstetrics and Gynecology

## 2023-07-06 ENCOUNTER — Encounter
Admission: RE | Disposition: A | Discharge status: Home / Self Care | Payer: Self-pay | Source: Home / Self Care | Attending: Obstetrics and Gynecology

## 2023-07-06 ENCOUNTER — Ambulatory Visit: Payer: Self-pay | Admitting: Urgent Care

## 2023-07-06 ENCOUNTER — Ambulatory Visit: Payer: Self-pay

## 2023-07-06 ENCOUNTER — Ambulatory Visit
Admission: RE | Admit: 2023-07-06 | Discharge: 2023-07-06 | Disposition: A | Discharge status: Home / Self Care | Payer: Self-pay | Attending: Obstetrics and Gynecology | Admitting: Obstetrics and Gynecology

## 2023-07-06 DIAGNOSIS — J45909 Unspecified asthma, uncomplicated: Secondary | ICD-10-CM | POA: Insufficient documentation

## 2023-07-06 DIAGNOSIS — D271 Benign neoplasm of left ovary: Secondary | ICD-10-CM | POA: Insufficient documentation

## 2023-07-06 DIAGNOSIS — Z79899 Other long term (current) drug therapy: Secondary | ICD-10-CM | POA: Insufficient documentation

## 2023-07-06 DIAGNOSIS — K219 Gastro-esophageal reflux disease without esophagitis: Secondary | ICD-10-CM | POA: Insufficient documentation

## 2023-07-06 DIAGNOSIS — N838 Other noninflammatory disorders of ovary, fallopian tube and broad ligament: Secondary | ICD-10-CM | POA: Insufficient documentation

## 2023-07-06 DIAGNOSIS — Z01818 Encounter for other preprocedural examination: Secondary | ICD-10-CM

## 2023-07-06 DIAGNOSIS — Z6841 Body Mass Index (BMI) 40.0 and over, adult: Secondary | ICD-10-CM | POA: Insufficient documentation

## 2023-07-06 DIAGNOSIS — I10 Essential (primary) hypertension: Secondary | ICD-10-CM | POA: Insufficient documentation

## 2023-07-06 LAB — POCT PREGNANCY, URINE: Preg Test, Ur: NEGATIVE

## 2023-07-06 LAB — ABO/RH: ABO/RH(D): O POS

## 2023-07-06 SURGERY — OOPHORECTOMY, LAPAROSCOPIC
Anesthesia: General | Laterality: Left

## 2023-07-06 MED ORDER — DEXAMETHASONE SODIUM PHOSPHATE 10 MG/ML IJ SOLN
INTRAMUSCULAR | Status: DC | PRN
  Administered 2023-07-06: 10 mg via INTRAVENOUS

## 2023-07-06 MED ORDER — FENTANYL CITRATE (PF) 100 MCG/2ML IJ SOLN
INTRAMUSCULAR | Status: DC | PRN
  Administered 2023-07-06 (×2): 50 ug via INTRAVENOUS

## 2023-07-06 MED ORDER — GABAPENTIN 300 MG PO CAPS
ORAL_CAPSULE | ORAL | Status: AC
  Filled 2023-07-06: qty 1

## 2023-07-06 MED ORDER — ROCURONIUM BROMIDE 100 MG/10ML IV SOLN
INTRAVENOUS | Status: DC | PRN
  Administered 2023-07-06: 10 mg via INTRAVENOUS
  Administered 2023-07-06: 40 mg via INTRAVENOUS

## 2023-07-06 MED ORDER — PROPOFOL 10 MG/ML IV BOLUS
INTRAVENOUS | Status: AC
  Filled 2023-07-06: qty 20

## 2023-07-06 MED ORDER — HYDROMORPHONE HCL 1 MG/ML IJ SOLN
0.2500 mg | INTRAMUSCULAR | Status: DC | PRN
  Administered 2023-07-06: 0.5 mg via INTRAVENOUS
  Administered 2023-07-06: 0.25 mg via INTRAVENOUS
  Administered 2023-07-06: 0.5 mg via INTRAVENOUS

## 2023-07-06 MED ORDER — OXYCODONE HCL 5 MG/5ML PO SOLN: 5.0000 mg | Freq: Once | ORAL | Status: AC | PRN

## 2023-07-06 MED ORDER — ALBUTEROL SULFATE HFA 108 (90 BASE) MCG/ACT IN AERS
INHALATION_SPRAY | RESPIRATORY_TRACT | Status: DC | PRN
Start: 2023-07-06 — End: 2023-07-06
  Administered 2023-07-06: 2 via RESPIRATORY_TRACT

## 2023-07-06 MED ORDER — PHENYLEPHRINE 80 MCG/ML (10ML) SYRINGE FOR IV PUSH (FOR BLOOD PRESSURE SUPPORT)
PREFILLED_SYRINGE | INTRAVENOUS | Status: DC | PRN
  Administered 2023-07-06 (×4): 80 ug via INTRAVENOUS

## 2023-07-06 MED ORDER — ALBUTEROL SULFATE HFA 108 (90 BASE) MCG/ACT IN AERS
INHALATION_SPRAY | RESPIRATORY_TRACT | Status: AC
  Filled 2023-07-06: qty 6.7

## 2023-07-06 MED ORDER — OXYCODONE HCL 5 MG PO TABS
ORAL_TABLET | ORAL | Status: AC
  Filled 2023-07-06: qty 1

## 2023-07-06 MED ORDER — HYDROMORPHONE HCL 1 MG/ML IJ SOLN
INTRAMUSCULAR | Status: AC
  Filled 2023-07-06: qty 1

## 2023-07-06 MED ORDER — SUCCINYLCHOLINE CHLORIDE 200 MG/10ML IV SOSY
PREFILLED_SYRINGE | INTRAVENOUS | Status: DC | PRN
  Administered 2023-07-06: 100 mg via INTRAVENOUS

## 2023-07-06 MED ORDER — MIDAZOLAM HCL 2 MG/2ML IJ SOLN
INTRAMUSCULAR | Status: DC | PRN
  Administered 2023-07-06: 2 mg via INTRAVENOUS

## 2023-07-06 MED ORDER — CHLORHEXIDINE GLUCONATE 0.12 % MT SOLN
OROMUCOSAL | Status: AC
  Filled 2023-07-06: qty 15

## 2023-07-06 MED ORDER — PROPOFOL 10 MG/ML IV BOLUS
INTRAVENOUS | Status: DC | PRN
Start: 2023-07-06 — End: 2023-07-06
  Administered 2023-07-06: 200 mg via INTRAVENOUS

## 2023-07-06 MED ORDER — 0.9 % SODIUM CHLORIDE (POUR BTL) OPTIME
TOPICAL | Status: DC | PRN
  Administered 2023-07-06: 500 mL

## 2023-07-06 MED ORDER — OXYCODONE HCL 5 MG PO TABS
5.0000 mg | ORAL_TABLET | Freq: Once | ORAL | Status: AC | PRN
  Administered 2023-07-06: 5 mg via ORAL

## 2023-07-06 MED ORDER — BUPIVACAINE HCL 0.5 % IJ SOLN
INTRAMUSCULAR | Status: DC | PRN
  Administered 2023-07-06 (×2): 8 mL

## 2023-07-06 MED ORDER — MIDAZOLAM HCL 2 MG/2ML IJ SOLN
INTRAMUSCULAR | Status: AC
  Filled 2023-07-06: qty 2

## 2023-07-06 MED ORDER — SUGAMMADEX SODIUM 200 MG/2ML IV SOLN
INTRAVENOUS | Status: DC | PRN
  Administered 2023-07-06: 250 mg via INTRAVENOUS

## 2023-07-06 MED ORDER — BUPIVACAINE HCL (PF) 0.5 % IJ SOLN
INTRAMUSCULAR | Status: AC
  Filled 2023-07-06: qty 30

## 2023-07-06 MED ORDER — ONDANSETRON HCL 4 MG/2ML IJ SOLN
INTRAMUSCULAR | Status: DC | PRN
  Administered 2023-07-06: 4 mg via INTRAVENOUS

## 2023-07-06 MED ORDER — LIDOCAINE HCL (CARDIAC) PF 100 MG/5ML IV SOSY
PREFILLED_SYRINGE | INTRAVENOUS | Status: DC | PRN
  Administered 2023-07-06: 100 mg via INTRAVENOUS

## 2023-07-06 MED ORDER — FENTANYL CITRATE (PF) 100 MCG/2ML IJ SOLN
INTRAMUSCULAR | Status: AC
  Filled 2023-07-06: qty 2

## 2023-07-06 MED ORDER — ACETAMINOPHEN 500 MG PO TABS
ORAL_TABLET | ORAL | Status: AC
  Filled 2023-07-06: qty 2

## 2023-07-06 MED ORDER — GLYCOPYRROLATE 0.2 MG/ML IJ SOLN
INTRAMUSCULAR | Status: DC | PRN
  Administered 2023-07-06: .2 mg via INTRAVENOUS

## 2023-07-06 SURGICAL SUPPLY — 58 items
ANCHOR TIS RET SYS 1550ML (BAG) IMPLANT
APL PRP STRL LF DISP 70% ISPRP (MISCELLANEOUS) ×1
BAG DRN RND TRDRP ANRFLXCHMBR (UROLOGICAL SUPPLIES)
BAG SPEC RTRVL C1550 25.4 (BAG) ×1
BAG URINE DRAIN 2000ML AR STRL (UROLOGICAL SUPPLIES) ×1 IMPLANT
BLADE SURG SZ11 CARB STEEL (BLADE) ×1 IMPLANT
CATH FOLEY 2WAY 5CC 16FR (CATHETERS) ×1
CATH ROBINSON RED A/P 16FR (CATHETERS) ×1 IMPLANT
CATH URTH 16FR FL 2W BLN LF (CATHETERS) ×1 IMPLANT
CHLORAPREP W/TINT 26 (MISCELLANEOUS) ×1 IMPLANT
DRSG TEGADERM 2-3/8X2-3/4 SM (GAUZE/BANDAGES/DRESSINGS) ×1 IMPLANT
GAUZE 4X4 16PLY ~~LOC~~+RFID DBL (SPONGE) ×1 IMPLANT
GAUZE SPONGE 2X2 STRL 8-PLY (GAUZE/BANDAGES/DRESSINGS) ×1 IMPLANT
GLOVE BIOGEL PI IND STRL 7.5 (GLOVE) ×1 IMPLANT
GLOVE SURG SYN 7.0 (GLOVE) ×1
GLOVE SURG SYN 7.0 PF PI (GLOVE) ×1 IMPLANT
GLOVE SURG SYN 8.0 (GLOVE) ×2
GLOVE SURG SYN 8.0 PF PI (GLOVE) ×2 IMPLANT
GOWN STRL REUS W/ TWL LRG LVL3 (GOWN DISPOSABLE) ×1 IMPLANT
GOWN STRL REUS W/ TWL XL LVL3 (GOWN DISPOSABLE) ×2 IMPLANT
GOWN STRL REUS W/TWL LRG LVL3 (GOWN DISPOSABLE) ×1
GOWN STRL REUS W/TWL XL LVL3 (GOWN DISPOSABLE) ×2
GRASPER SUT TROCAR 14GX15 (MISCELLANEOUS) ×1 IMPLANT
HANDLE YANKAUER SUCT BULB TIP (MISCELLANEOUS) IMPLANT
IRRIGATION STRYKERFLOW (MISCELLANEOUS) ×1 IMPLANT
IRRIGATOR STRYKERFLOW (MISCELLANEOUS)
IV NS 1000ML (IV SOLUTION)
IV NS 1000ML BAXH (IV SOLUTION) ×1 IMPLANT
KIT PINK PAD W/HEAD ARE REST (MISCELLANEOUS) ×1
KIT PINK PAD W/HEAD ARM REST (MISCELLANEOUS) ×1 IMPLANT
KIT TURNOVER CYSTO (KITS) ×1 IMPLANT
KITTNER LAPARASCOPIC 5X40 (MISCELLANEOUS) IMPLANT
LABEL OR SOLS (LABEL) ×1 IMPLANT
MANIFOLD NEPTUNE II (INSTRUMENTS) ×1 IMPLANT
NS IRRIG 500ML POUR BTL (IV SOLUTION) ×1 IMPLANT
PACK GYN LAPAROSCOPIC (MISCELLANEOUS) ×1 IMPLANT
PAD OB MATERNITY 4.3X12.25 (PERSONAL CARE ITEMS) ×1 IMPLANT
PAD PREP OB/GYN DISP 24X41 (PERSONAL CARE ITEMS) ×1 IMPLANT
SCISSORS METZENBAUM CVD 33 (INSTRUMENTS) IMPLANT
SCRUB CHG 4% DYNA-HEX 4OZ (MISCELLANEOUS) ×1 IMPLANT
SET TUBE SMOKE EVAC HIGH FLOW (TUBING) ×1 IMPLANT
SHEARS HARMONIC ACE PLUS 36CM (ENDOMECHANICALS) ×1 IMPLANT
SLEEVE Z-THREAD 5X100MM (TROCAR) ×1 IMPLANT
STRIP CLOSURE SKIN 1/4X4 (GAUZE/BANDAGES/DRESSINGS) ×1 IMPLANT
SUT VIC AB 0 CT1 36 (SUTURE) ×1 IMPLANT
SUT VIC AB 2-0 UR6 27 (SUTURE) ×1 IMPLANT
SUT VIC AB 4-0 SH 27 (SUTURE) ×1
SUT VIC AB 4-0 SH 27XANBCTRL (SUTURE) ×1 IMPLANT
SWABSTK COMLB BENZOIN TINCTURE (MISCELLANEOUS) ×1 IMPLANT
SYR 50ML LL SCALE MARK (SYRINGE) IMPLANT
SYS BAG RETRIEVAL 10MM (BASKET) ×1
SYSTEM BAG RETRIEVAL 10MM (BASKET) ×1 IMPLANT
TRAP FLUID SMOKE EVACUATOR (MISCELLANEOUS) ×1 IMPLANT
TROCAR BLADELESS 15MM (ENDOMECHANICALS) IMPLANT
TROCAR Z THREAD OPTICAL 5X150 (TROCAR) IMPLANT
TROCAR Z-THRD FIOS HNDL 11X100 (TROCAR) ×1 IMPLANT
TROCAR Z-THREAD FIOS 5X100MM (TROCAR) ×1 IMPLANT
WATER STERILE IRR 500ML POUR (IV SOLUTION) ×1 IMPLANT

## 2023-07-06 NOTE — Anesthesia Procedure Notes (Signed)
Procedure Name: Intubation Date/Time: 07/06/2023 2:17 PM  Performed by: Mohammed Kindle, CRNAPre-anesthesia Checklist: Patient identified, Emergency Drugs available, Suction available and Patient being monitored Patient Re-evaluated:Patient Re-evaluated prior to induction Oxygen Delivery Method: Circle system utilized Preoxygenation: Pre-oxygenation with 100% oxygen Induction Type: IV induction Ventilation: Mask ventilation without difficulty Laryngoscope Size: McGraph and 3 Grade View: Grade I Tube type: Oral Tube size: 7.0 mm Number of attempts: 1 Airway Equipment and Method: Stylet Placement Confirmation: ETT inserted through vocal cords under direct vision, positive ETCO2, breath sounds checked- equal and bilateral and CO2 detector Secured at: 21 cm Tube secured with: Tape Dental Injury: Teeth and Oropharynx as per pre-operative assessment

## 2023-07-06 NOTE — Anesthesia Preprocedure Evaluation (Signed)
Anesthesia Evaluation  Patient identified by MRN, date of birth, ID band Patient awake    Reviewed: Allergy & Precautions, NPO status , Patient's Chart, lab work & pertinent test results  Airway Mallampati: II  TM Distance: >3 FB Neck ROM: full    Dental no notable dental hx.    Pulmonary asthma    Pulmonary exam normal        Cardiovascular hypertension, On Medications negative cardio ROS Normal cardiovascular exam     Neuro/Psych negative neurological ROS  negative psych ROS   GI/Hepatic Neg liver ROS,GERD  Medicated,,  Endo/Other    Morbid obesity  Renal/GU      Musculoskeletal   Abdominal   Peds  Hematology negative hematology ROS (+)   Anesthesia Other Findings Past Medical History: No date: Anemia 06/22/2011: Appendicitis No date: Asthma No date: Environmental allergies No date: GERD (gastroesophageal reflux disease) No date: Hydradenitis No date: Hyperlipidemia No date: Hypertension No date: Hypothyroidism No date: Morbid obesity with BMI of 50.0-59.9, adult (HCC) 06/2023: Right ovarian cyst  Past Surgical History: 05/24/2011: APPENDECTOMY 1989: TYMPANOSTOMY TUBE PLACEMENT; Bilateral  BMI    Body Mass Index: 55.54 kg/m      Reproductive/Obstetrics negative OB ROS                             Anesthesia Physical Anesthesia Plan  ASA: 3  Anesthesia Plan: General ETT   Post-op Pain Management: Tylenol PO (pre-op)*, Gabapentin PO (pre-op)*, Toradol IV (intra-op)* and Ketamine IV*   Induction: Intravenous  PONV Risk Score and Plan: 4 or greater and Ondansetron, Dexamethasone, Midazolam and Treatment may vary due to age or medical condition  Airway Management Planned: Oral ETT  Additional Equipment:   Intra-op Plan:   Post-operative Plan: Extubation in OR  Informed Consent: I have reviewed the patients History and Physical, chart, labs and discussed the  procedure including the risks, benefits and alternatives for the proposed anesthesia with the patient or authorized representative who has indicated his/her understanding and acceptance.     Dental Advisory Given  Plan Discussed with: Anesthesiologist, CRNA and Surgeon  Anesthesia Plan Comments: (Patient consented for risks of anesthesia including but not limited to:  - adverse reactions to medications - damage to eyes, teeth, lips or other oral mucosa - nerve damage due to positioning  - sore throat or hoarseness - Damage to heart, brain, nerves, lungs, other parts of body or loss of life  Patient voiced understanding.)        Anesthesia Quick Evaluation

## 2023-07-06 NOTE — Brief Op Note (Signed)
07/06/2023  4:05 PM  PATIENT:  [name]  42 y.o. female  PRE-OPERATIVE DIAGNOSIS:  complex right ovarian cyst  POST-OPERATIVE DIAGNOSIS:  Left ovarian dermoid cyst  PROCEDURE:  Procedure(s): LAPAROSCOPIC OOPHORECTOMY (Left)  SURGEON:  Surgeons and Role:    * Arlynn Stare, Ihor Austin, MD - Primary    * Shayla Heming, Festus Holts, MD - Assisting  PHYSICIAN ASSISTANT:   ASSISTANTS: none   ANESTHESIA:   general  EBL:  5 mL   BLOOD ADMINISTERED:none  DRAINS: none   LOCAL MEDICATIONS USED:  LIDOCAINE   SPECIMEN:  Source of Specimen:  left fallopian tube and ovary   DISPOSITION OF SPECIMEN:  PATHOLOGY  COUNTS:  YES  TOURNIQUET:  * No tourniquets in log *  DICTATION: .Other Dictation: Dictation Number verbal  PLAN OF CARE: Discharge to home after PACU  PATIENT DISPOSITION:  PACU - hemodynamically stable.   Delay start of Pharmacological VTE agent (>24hrs) due to surgical blood loss or risk of bleeding: not applicable

## 2023-07-06 NOTE — Op Note (Signed)
NAME: [name], [name] MEDICAL RECORD NO: [id number] ACCOUNT NO: [id number] DATE OF BIRTH: 1981/09/26 FACILITY: [facility] LOCATION: [facility] PHYSICIAN: Suzy Bouchard, MD  Operative Report   DATE OF PROCEDURE: 07/06/2023  PREOPERATIVE DIAGNOSIS:  Right ovarian complex cyst.  POSTOPERATIVE DIAGNOSES: 1.  Left ovarian complex cyst. 2.  Atretic right ovary.  PROCEDURE:  Laparoscopic left salpingo-oophorectomy.  SURGEON:  Suzy Bouchard, MD  FIRST ASSISTANT:  Kathalene Frames, MD  ANESTHESIA:  General endotracheal anesthesia.  INDICATIONS:  A 42 year old gravida 0. Patient was seen in the Emergency Department with acute onset of left lower quadrant pain and was noted to have what appeared to be a 10 cm complex right ovarian cyst.  The patient is scheduled for  oophorectomy contents  consistent with a dermoid cyst.  DESCRIPTION OF PROCEDURE:  After adequate general endotracheal anesthesia, the patient was placed in dorsal supine position. Abdomen, perineum and vagina were prepped and draped in normal sterile fashion.  Timeout was performed.  Foley catheter placed  into the bladder during the laparoscopic procedure and a sponge stick was placed in the vagina to be used for uterine manipulation during the procedure.  Gloves and gown were changed.  Attention was directed to the patient's abdomen where a 5 mm  infraumbilical incision was made after injecting with 0.5% Marcaine.  A 5 mm laparoscope was advanced into the abdominal cavity under direct visualization.  The patient's abdomen was insufflated.  Second port placement was placed left lower quadrant, 11  mm trocar was advanced under direct visualization.  A right port site was placed at 3 cm medial to the right anterior iliac spine and a 5 mm trocar was advanced into the abdominal cavity under direct visualization.  The patient was placed in  Trendelenburg.  Initial evaluation of the pelvis revealed the large complex  cyst was in fact on the left side, approximately 13 cm and the right ovary appeared very atretic.  The left infundibulopelvic ligament was cauterized with the Kleppinger and  transected with Harmonic scalpel.  Ultimately, the left complex ovary and fallopian tube were transected and freed from the uterus.  Given the size of the cyst, a 15 mm trocar was replaced on the left lower port site and a large Endobag was placed into  the abdominal cavity and the complex cyst was placed within the bag.  The fascia on the left side was extended with blunt transverse traction and with the Mayo scissors.  Ultimately, the cyst wall was brought to the opening of the abdominal cavity and  scissors were used to rupture the cyst wall, which demonstrated large amount of liquid fatty material. Secondary cyst was also opened still and closed in the Endobag and was drained with the suction.  Ultimately, the rest the ovary was removed in an  intact bag through the left port site.  The patient's abdomen was reinsufflated.  The left infundibulopelvic ligament pedicle appeared hemostatic.  Pressure was lowered and still appeared hemostatic.  The left port fascial opening was closed with three  separate sutures utilizing the Carter-Thomason PMI apparatus.  Good approximation of fascial edges.  The patient's abdomen was deflated and all trocars were then removed.  The port sites were closed with interrupted 4-0 Vicryl suture with good cosmetic  effect.  Foley catheter removed and the sponge stick were removed.  There were no complications.  The patient received 30 mg Toradol at the end of the procedure.  ESTIMATED BLOOD LOSS:  5 mL.  INTRAOPERATIVE FLUIDS:  500 mL  URINE OUTPUT:  150 mL  The patient tolerated the procedure well and was taken to recovery room in good condition.   VAI D: 07/06/2023 5:24:16 pm T: 07/06/2023 8:55:00 pm  JOB: [id number]/ [id number]

## 2023-07-06 NOTE — Progress Notes (Signed)
Pt ready for L/ S right oophorectomy . Abd marked . Labs reviewed . All question answered . Neg hcg today  Proceed

## 2023-07-06 NOTE — Transfer of Care (Signed)
Immediate Anesthesia Transfer of Care Note  Patient: [name]  Procedure(s) Performed: LAPAROSCOPIC OOPHORECTOMY (Left)  Patient Location: PACU  Anesthesia Type:General  Level of Consciousness: awake and drowsy  Airway & Oxygen Therapy: Patient Spontanous Breathing and Patient connected to face mask oxygen  Post-op Assessment: Report given to RN and Post -op Vital signs reviewed and stable  Post vital signs: Reviewed and stable  Last Vitals:  Vitals Value Taken Time  BP 137/83 07/06/23 1616  Temp 35.9 1616  Pulse 72 07/06/23 1624  Resp 20 07/06/23 1624  SpO2 98 % 07/06/23 1624  Vitals shown include unfiled device data.  Last Pain:  Vitals:   07/06/23 1200  TempSrc: Oral  PainSc:          Complications: No notable events documented.

## 2023-07-06 NOTE — Discharge Instructions (Signed)
AMBULATORY SURGERY  DISCHARGE INSTRUCTIONS   The drugs that you were given will stay in your system until tomorrow so for the next 24 hours you should not:  Drive an automobile Make any legal decisions Drink any alcoholic beverage   You may resume regular meals tomorrow.  Today it is better to start with liquids and gradually work up to solid foods.  You may eat anything you prefer, but it is better to start with liquids, then soup and crackers, and gradually work up to solid foods.   Please notify your doctor immediately if you have any unusual bleeding, trouble breathing, redness and pain at the surgery site, drainage, fever, or pain not relieved by medication.    Additional Instructions: 

## 2023-07-11 LAB — SURGICAL PATHOLOGY

## 2023-07-12 NOTE — Anesthesia Postprocedure Evaluation (Signed)
Anesthesia Post Note  Patient: [name]  Procedure(s) Performed: LAPAROSCOPIC OOPHORECTOMY (Left)  Patient location during evaluation: PACU Anesthesia Type: General Level of consciousness: awake and alert Pain management: pain level controlled Vital Signs Assessment: post-procedure vital signs reviewed and stable Respiratory status: spontaneous breathing, nonlabored ventilation, respiratory function stable and patient connected to nasal cannula oxygen Cardiovascular status: blood pressure returned to baseline and stable Postop Assessment: no apparent nausea or vomiting Anesthetic complications: no   No notable events documented.   Last Vitals:  Vitals:   07/06/23 1740 07/06/23 1746  BP:  122/80  Pulse:  77  Resp: 14 16  Temp:  (!) 36.1 C  SpO2:  98%    Last Pain:  Vitals:   07/06/23 1746  TempSrc: Temporal  PainSc: 5                  Lenard Simmer

## 2023-07-19 ENCOUNTER — Other Ambulatory Visit: Payer: Self-pay | Admitting: Nurse Practitioner

## 2023-07-19 DIAGNOSIS — I10 Essential (primary) hypertension: Secondary | ICD-10-CM

## 2023-07-20 ENCOUNTER — Other Ambulatory Visit: Payer: Self-pay | Admitting: Nurse Practitioner

## 2023-07-20 DIAGNOSIS — I10 Essential (primary) hypertension: Secondary | ICD-10-CM

## 2023-07-21 ENCOUNTER — Telehealth: Payer: Self-pay | Admitting: Nurse Practitioner

## 2023-07-21 MED ORDER — BISOPROLOL-HYDROCHLOROTHIAZIDE 10-6.25 MG PO TABS
ORAL_TABLET | ORAL | 0 refills | Status: DC
Start: 2023-07-21 — End: 2023-08-18

## 2023-07-21 NOTE — Telephone Encounter (Signed)
I sent script to pharmacy.

## 2023-07-21 NOTE — Telephone Encounter (Signed)
Pt called to make an appt. While on the phone, she mentioned she wants a refill on Ziac. Pharm: [facility], [place].

## 2023-08-07 NOTE — Progress Notes (Unsigned)
Assessment and Plan:  There are no diagnoses linked to this encounter.    Further disposition pending results of labs. Discussed med's effects and SE's.   Over 30 minutes of exam, counseling, chart review, and critical decision making was performed.   Future Appointments  Date Time Provider Department Center  08/08/2023 10:00 AM Raynelle Dick, NP [facility] None    ------------------------------------------------------------------------------------------------------------------   HPI There were no vitals taken for this visit. 42 y.o.female presents for  Past Medical History:  Diagnosis Date   Anemia    Appendicitis 06/22/2011   Asthma    Environmental allergies    GERD (gastroesophageal reflux disease)    Hydradenitis    Hyperlipidemia    Hypertension    Hypothyroidism    Morbid obesity with BMI of 50.0-59.9, adult (HCC)    Right ovarian cyst 06/2023     Allergies  Allergen Reactions   Shellfish Allergy Anaphylaxis   Aspirin     Stomach pain/ burning    Avelox [Moxifloxacin Hcl In Nacl]     hives   Azithromycin     Flu like symptoms   Ibuprofen     Stomach pain     Current Outpatient Medications on File Prior to Visit  Medication Sig   albuterol (PROVENTIL) (2.5 MG/3ML) 0.083% nebulizer solution Take 3 mLs (2.5 mg total) by nebulization every 4 (four) hours as needed for wheezing or shortness of breath.   albuterol (VENTOLIN HFA) 108 (90 Base) MCG/ACT inhaler Inhale into the lungs every 6 (six) hours as needed for wheezing or shortness of breath.   Biotin 2500 MCG CHEW Chew 1 tablet by mouth at bedtime.   bisoprolol-hydrochlorothiazide (ZIAC) 10-6.25 MG tablet TAKE 1 TABLET BY MOUTH EVERY MORNING FOR BLOOD PRESSURE   cetirizine (ZYRTEC) 10 MG tablet Take 10 mg by mouth at bedtime.   Cinnamon (GNP CINNAMON) 500 MG capsule Take 1,000 mg by mouth at bedtime.   doxycycline (VIBRAMYCIN) 100 MG capsule Take 1 capsule Daily with Food for Hidradenitis Suppurativa  Prophylaxis   Ferrous Sulfate (IRON) 325 (65 Fe) MG TABS Take 2 tablets by mouth at bedtime.   FIBER ADULT GUMMIES PO Take 1 tablet by mouth at bedtime.   montelukast (SINGULAIR) 10 MG tablet TAKE ONE TABLET BY MOUTH DAILY FOR ALLERGIES (Patient taking differently: Take 10 mg by mouth at bedtime. TAKE ONE TABLET BY MOUTH DAILY FOR ALLERGIES)   MULTIPLE VITAMIN PO Take 1 tablet by mouth at bedtime.   Norgestimate-Ethinyl Estradiol Triphasic (TRI-LO-MARZIA) 0.18/0.215/0.25 MG-25 MCG tab Take 1 tablet by mouth daily. (Patient taking differently: Take 1 tablet by mouth at bedtime.)   omeprazole (PRILOSEC) 20 MG capsule Take 20 mg by mouth daily.    Probiotic Product (ALIGN DUALBIOTIC PO) Take 1 capsule by mouth at bedtime.   triamcinolone ointment (KENALOG) 0.5 % Apply 1 application topically as needed.   No current facility-administered medications on file prior to visit.    ROS: all negative except above.   Physical Exam:  There were no vitals taken for this visit.  General Appearance: Well nourished, in no apparent distress. Eyes: PERRLA, EOMs, conjunctiva no swelling or erythema Sinuses: No Frontal/maxillary tenderness ENT/Mouth: Ext aud canals clear, TMs without erythema, bulging. No erythema, swelling, or exudate on post pharynx.  Tonsils not swollen or erythematous. Hearing normal.  Neck: Supple, thyroid normal.  Respiratory: Respiratory effort normal, BS equal bilaterally without rales, rhonchi, wheezing or stridor.  Cardio: RRR with no MRGs. Brisk peripheral pulses without edema.  Abdomen: Soft, +  BS.  Non tender, no guarding, rebound, hernias, masses. Lymphatics: Non tender without lymphadenopathy.  Musculoskeletal: Full ROM, 5/5 strength, normal gait.  Skin: Warm, dry without rashes, lesions, ecchymosis.  Neuro: Cranial nerves intact. Normal muscle tone, no cerebellar symptoms. Sensation intact.  Psych: Awake and oriented X 3, normal affect, Insight and Judgment appropriate.      Raynelle Dick, NP 10:54 AM [facility]

## 2023-08-08 ENCOUNTER — Encounter: Payer: Self-pay | Admitting: Nurse Practitioner

## 2023-08-08 ENCOUNTER — Ambulatory Visit (INDEPENDENT_AMBULATORY_CARE_PROVIDER_SITE_OTHER): Payer: Self-pay | Admitting: Nurse Practitioner

## 2023-08-08 VITALS — BP 162/92 | HR 89 | Temp 97.5°F | Ht 59.0 in | Wt 263.4 lb

## 2023-08-08 DIAGNOSIS — I10 Essential (primary) hypertension: Secondary | ICD-10-CM

## 2023-08-08 MED ORDER — LOSARTAN POTASSIUM 25 MG PO TABS
25.0000 mg | ORAL_TABLET | Freq: Every day | ORAL | 11 refills | Status: DC
Start: 2023-08-08 — End: 2023-09-06

## 2023-08-08 NOTE — Patient Instructions (Signed)
Continue Bisoprolol/hydrochlorothiazide 10/6.25 mg daily and add Losartan 25 mg 1 tab daily  Check BP daily and keep log. Follow up in 4 weeks and bring log with you   Hypertension, Adult Hypertension is another name for high blood pressure. High blood pressure forces your heart to work harder to pump blood. This can cause problems over time. There are two numbers in a blood pressure reading. There is a top number (systolic) over a bottom number (diastolic). It is best to have a blood pressure that is below 120/80. What are the causes? The cause of this condition is not known. Some other conditions can lead to high blood pressure. What increases the risk? Some lifestyle factors can make you more likely to develop high blood pressure: Smoking. Not getting enough exercise or physical activity. Being overweight. Having too much fat, sugar, calories, or salt (sodium) in your diet. Drinking too much alcohol. Other risk factors include: Having any of these conditions: Heart disease. Diabetes. High cholesterol. Kidney disease. Obstructive sleep apnea. Having a family history of high blood pressure and high cholesterol. Age. The risk increases with age. Stress. What are the signs or symptoms? High blood pressure may not cause symptoms. Very high blood pressure (hypertensive crisis) may cause: Headache. Fast or uneven heartbeats (palpitations). Shortness of breath. Nosebleed. Vomiting or feeling like you may vomit (nauseous). Changes in how you see. Very bad chest pain. Feeling dizzy. Seizures. How is this treated? This condition is treated by making healthy lifestyle changes, such as: Eating healthy foods. Exercising more. Drinking less alcohol. Your doctor may prescribe medicine if lifestyle changes do not help enough and if: Your top number is above 130. Your bottom number is above 80. Your personal target blood pressure may vary. Follow these instructions at home: Eating  and drinking  If told, follow the DASH eating plan. To follow this plan: Fill one half of your plate at each meal with fruits and vegetables. Fill one fourth of your plate at each meal with whole grains. Whole grains include whole-wheat pasta, brown rice, and whole-grain bread. Eat or drink low-fat dairy products, such as skim milk or low-fat yogurt. Fill one fourth of your plate at each meal with low-fat (lean) proteins. Low-fat proteins include fish, chicken without skin, eggs, beans, and tofu. Avoid fatty meat, cured and processed meat, or chicken with skin. Avoid pre-made or processed food. Limit the amount of salt in your diet to less than 1,500 mg each day. Do not drink alcohol if: Your doctor tells you not to drink. You are pregnant, may be pregnant, or are planning to become pregnant. If you drink alcohol: Limit how much you have to: 0-1 drink a day for women. 0-2 drinks a day for men. Know how much alcohol is in your drink. In the [place] one drink equals one 12 oz bottle of beer (355 mL), one 5 oz glass of wine (148 mL), or one 1 oz glass of hard liquor (44 mL). Lifestyle  Work with your doctor to stay at a healthy weight or to lose weight. Ask your doctor what the best weight is for you. Get at least 30 minutes of exercise that causes your heart to beat faster (aerobic exercise) most days of the week. This may include walking, swimming, or biking. Get at least 30 minutes of exercise that strengthens your muscles (resistance exercise) at least 3 days a week. This may include lifting weights or doing Pilates. Do not smoke or use any products that contain  nicotine or tobacco. If you need help quitting, ask your doctor. Check your blood pressure at home as told by your doctor. Keep all follow-up visits. Medicines Take over-the-counter and prescription medicines only as told by your doctor. Follow directions carefully. Do not skip doses of blood pressure medicine. The medicine does  not work as well if you skip doses. Skipping doses also puts you at risk for problems. Ask your doctor about side effects or reactions to medicines that you should watch for. Contact a doctor if: You think you are having a reaction to the medicine you are taking. You have headaches that keep coming back. You feel dizzy. You have swelling in your ankles. You have trouble with your vision. Get help right away if: You get a very bad headache. You start to feel mixed up (confused). You feel weak or numb. You feel faint. You have very bad pain in your: Chest. Belly (abdomen). You vomit more than once. You have trouble breathing. These symptoms may be an emergency. Get help right away. Call 911. Do not wait to see if the symptoms will go away. Do not drive yourself to the hospital. Summary Hypertension is another name for high blood pressure. High blood pressure forces your heart to work harder to pump blood. For most people, a normal blood pressure is less than 120/80. Making healthy choices can help lower blood pressure. If your blood pressure does not get lower with healthy choices, you may need to take medicine. This information is not intended to replace advice given to you by your health care provider. Make sure you discuss any questions you have with your health care provider. Document Revised: 07/29/2021 Document Reviewed: 07/29/2021 Elsevier Patient Education  2024 [employer].

## 2023-08-18 ENCOUNTER — Other Ambulatory Visit: Payer: Self-pay | Admitting: Nurse Practitioner

## 2023-08-18 DIAGNOSIS — I10 Essential (primary) hypertension: Secondary | ICD-10-CM

## 2023-08-19 ENCOUNTER — Other Ambulatory Visit: Payer: Self-pay | Admitting: Nurse Practitioner

## 2023-09-05 NOTE — Progress Notes (Unsigned)
Assessment and Plan:  [name] was seen today for medication management.  Diagnoses and all orders for this visit:  Hypertension, unspecified type Continue Bisoprolol/hydrochlorothiazide 10/6.25 mg daily and increase Losartan to 50 mg 1 tab daily Check BP daily and keep log. Follow up in 4 weeks and bring log with you - continue DASH diet, exercise and monitor at home. Call if greater than 130/80.  Go to the ER if any chest pain, shortness of breath, nausea, dizziness, severe HA, changes vision/speech  -     losartan (COZAAR) 50 MG tablet; Take 1 tablet (50 mg total) by mouth daily.  Morbid obesity (HCC) Recommended diet heavy in fruits and veggies and low in animal meats, cheeses, and dairy products, appropriate calorie intake Patient will work on increasing activity and limiting saturated fats and simple carbs. Follow up at next visit   Environmental allergies Continue Zyrtec every day , Singulair every day and Albuterol as needed     Further disposition pending results of labs. Discussed med's effects and SE's.   Over 30 minutes of exam, counseling, chart review, and critical decision making was performed.   Future Appointments  Date Time Provider Department Center  09/06/2023 10:30 AM Raynelle Dick, NP [facility] None     ------------------------------------------------------------------------------------------------------------------   HPI BP (!) 162/94   Pulse 75   Temp (!) 97.5 F (36.4 C)   Ht 4\' 11"  (1.499 m)   Wt 267 lb 9.6 oz (121.4 kg)   SpO2 98%   BMI 54.05 kg/m    42 y.o.female presents for recheck of BP. She was started on Losartan 25 mg every day in addition to her Ziac 10/6.25 mg every day at last visit for elevated BP. Denies headaches, chest pain, shortness of breath and dizziness.  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 She has been doing well since, minimal tenderness.   Continues to follow with GYN. Stopped OC's in 12/2022 and had no period in 05/2023 but was very heavy and lots of cramps and restarted her OC's. She goes back to GYN next week.   Currently on Bisoprolol/hydrochlorothiazide 10/6.25 mg every day and Losartan 25 mg every day - has not currently checked at home.BP has been running 130-150/60-70.  BP Readings from Last 3 Encounters:  09/06/23 (!) 162/94  08/08/23 (!) 162/92  07/06/23 122/80  Denies headaches, chest pain, shortness of breath and dizziness   BMI is Body mass index is 54.05 kg/m., she has not been working on diet and exercise. Wt Readings from Last 3 Encounters:  09/06/23 267 lb 9.6 oz (121.4 kg)  08/08/23 263 lb 6.4 oz (119.5 kg)  07/06/23 275 lb (124.7 kg)     Past Medical History:  Diagnosis Date   Anemia    Appendicitis 06/22/2011   Asthma    Environmental allergies    GERD (gastroesophageal reflux disease)    Hydradenitis    Hyperlipidemia    Hypertension    Hypothyroidism    Morbid obesity with BMI of 50.0-59.9, adult (HCC)    Right ovarian cyst 06/2023     Allergies  Allergen Reactions   Shellfish Allergy Anaphylaxis   Aspirin     Stomach pain/ burning    Avelox [Moxifloxacin Hcl In Nacl]     hives   Azithromycin     Flu like symptoms   Ibuprofen     Stomach pain    Naproxen Sodium Other (See Comments)  Stomach pain    Current Outpatient Medications on File Prior to Visit  Medication Sig   albuterol (VENTOLIN HFA) 108 (90 Base) MCG/ACT inhaler Inhale into the lungs every 6 (six) hours as needed for wheezing or shortness of breath.   Biotin 2500 MCG CHEW Chew 1 tablet by mouth at bedtime.   bisoprolol-hydrochlorothiazide (ZIAC) 10-6.25 MG tablet TAKE 1 TABLET BY MOUTH EVERY MORNING FOR BLOOD PRESSURE   cetirizine (ZYRTEC) 10 MG tablet Take 10 mg by mouth at bedtime.   Cinnamon (GNP CINNAMON) 500 MG capsule Take 1,000 mg by mouth at bedtime.   doxycycline (VIBRAMYCIN) 100 MG capsule Take 1 capsule  Daily with Food for Hidradenitis Suppurativa Prophylaxis   Ferrous Sulfate (IRON) 325 (65 Fe) MG TABS Take 2 tablets by mouth at bedtime.   FIBER ADULT GUMMIES PO Take 1 tablet by mouth at bedtime.   ipratropium-albuterol (DUONEB) 0.5-2.5 (3) MG/3ML SOLN Inhale into the lungs.   losartan (COZAAR) 25 MG tablet Take 1 tablet (25 mg total) by mouth daily.   montelukast (SINGULAIR) 10 MG tablet TAKE ONE TABLET BY MOUTH DAILY FOR ALLERGIES (Patient taking differently: Take 10 mg by mouth at bedtime. TAKE ONE TABLET BY MOUTH DAILY FOR ALLERGIES)   MULTIPLE VITAMIN PO Take 1 tablet by mouth at bedtime.   omeprazole (PRILOSEC) 20 MG capsule Take 20 mg by mouth daily.    Probiotic Product (ALIGN DUALBIOTIC PO) Take 1 capsule by mouth at bedtime.   TRI-LO-MARZIA 0.18/0.215/0.25 MG-25 MCG tab TAKE 1 TABLET BY MOUTH DAILY   triamcinolone ointment (KENALOG) 0.5 % Apply 1 application topically as needed.   albuterol (PROVENTIL) (2.5 MG/3ML) 0.083% nebulizer solution Take 3 mLs (2.5 mg total) by nebulization every 4 (four) hours as needed for wheezing or shortness of breath.   No current facility-administered medications on file prior to visit.    ROS: all negative except above.   Physical Exam:  BP (!) 162/94   Pulse 75   Temp (!) 97.5 F (36.4 C)   Ht 4\' 11"  (1.499 m)   Wt 267 lb 9.6 oz (121.4 kg)   SpO2 98%   BMI 54.05 kg/m   General Appearance: Well nourished, in no apparent distress. Eyes: PERRLA, EOMs, conjunctiva no swelling or erythema Sinuses: No Frontal/maxillary tenderness ENT/Mouth: Ext aud canals clear, TMs without erythema, bulging. No erythema, swelling, or exudate on post pharynx. Hearing normal.  Neck: Supple, thyroid normal.  Respiratory: Respiratory effort normal, BS equal bilaterally without rales, rhonchi, wheezing or stridor.  Cardio: RRR with no MRGs. Brisk peripheral pulses without edema.  Abdomen: Soft, + BS.  Incisions healing  Musculoskeletal: Full ROM, 5/5 strength,  normal gait.  Neuro: Cranial nerves intact. Normal muscle tone, no cerebellar symptoms. Sensation intact.  Psych: Awake and oriented X 3, normal affect, Insight and Judgment appropriate.     Raynelle Dick, NP 10:28 AM [facility]

## 2023-09-06 ENCOUNTER — Ambulatory Visit: Payer: Self-pay | Admitting: Nurse Practitioner

## 2023-09-06 ENCOUNTER — Encounter: Payer: Self-pay | Admitting: Nurse Practitioner

## 2023-09-06 DIAGNOSIS — Z9109 Other allergy status, other than to drugs and biological substances: Secondary | ICD-10-CM

## 2023-09-06 DIAGNOSIS — I10 Essential (primary) hypertension: Secondary | ICD-10-CM

## 2023-09-06 MED ORDER — LOSARTAN POTASSIUM 50 MG PO TABS
50.0000 mg | ORAL_TABLET | Freq: Every day | ORAL | 11 refills | Status: DC
Start: 2023-09-06 — End: 2023-10-10

## 2023-09-06 MED ORDER — ALBUTEROL SULFATE HFA 108 (90 BASE) MCG/ACT IN AERS: 2.0000 | INHALATION_SPRAY | Freq: Four times a day (QID) | RESPIRATORY_TRACT | 2 refills | Status: AC | PRN

## 2023-09-06 NOTE — Patient Instructions (Signed)
Increase Losartan to 50 mg every day Follow up in 1 month Go to the ER if any chest pain, shortness of breath, nausea, dizziness, severe HA, changes vision/speech   Hypertension, Adult Hypertension is another name for high blood pressure. High blood pressure forces your heart to work harder to pump blood. This can cause problems over time. There are two numbers in a blood pressure reading. There is a top number (systolic) over a bottom number (diastolic). It is best to have a blood pressure that is below 120/80. What are the causes? The cause of this condition is not known. Some other conditions can lead to high blood pressure. What increases the risk? Some lifestyle factors can make you more likely to develop high blood pressure: Smoking. Not getting enough exercise or physical activity. Being overweight. Having too much fat, sugar, calories, or salt (sodium) in your diet. Drinking too much alcohol. Other risk factors include: Having any of these conditions: Heart disease. Diabetes. High cholesterol. Kidney disease. Obstructive sleep apnea. Having a family history of high blood pressure and high cholesterol. Age. The risk increases with age. Stress. What are the signs or symptoms? High blood pressure may not cause symptoms. Very high blood pressure (hypertensive crisis) may cause: Headache. Fast or uneven heartbeats (palpitations). Shortness of breath. Nosebleed. Vomiting or feeling like you may vomit (nauseous). Changes in how you see. Very bad chest pain. Feeling dizzy. Seizures. How is this treated? This condition is treated by making healthy lifestyle changes, such as: Eating healthy foods. Exercising more. Drinking less alcohol. Your doctor may prescribe medicine if lifestyle changes do not help enough and if: Your top number is above 130. Your bottom number is above 80. Your personal target blood pressure may vary. Follow these instructions at home: Eating and  drinking  If told, follow the DASH eating plan. To follow this plan: Fill one half of your plate at each meal with fruits and vegetables. Fill one fourth of your plate at each meal with whole grains. Whole grains include whole-wheat pasta, brown rice, and whole-grain bread. Eat or drink low-fat dairy products, such as skim milk or low-fat yogurt. Fill one fourth of your plate at each meal with low-fat (lean) proteins. Low-fat proteins include fish, chicken without skin, eggs, beans, and tofu. Avoid fatty meat, cured and processed meat, or chicken with skin. Avoid pre-made or processed food. Limit the amount of salt in your diet to less than 1,500 mg each day. Do not drink alcohol if: Your doctor tells you not to drink. You are pregnant, may be pregnant, or are planning to become pregnant. If you drink alcohol: Limit how much you have to: 0-1 drink a day for women. 0-2 drinks a day for men. Know how much alcohol is in your drink. In the [place] one drink equals one 12 oz bottle of beer (355 mL), one 5 oz glass of wine (148 mL), or one 1 oz glass of hard liquor (44 mL). Lifestyle  Work with your doctor to stay at a healthy weight or to lose weight. Ask your doctor what the best weight is for you. Get at least 30 minutes of exercise that causes your heart to beat faster (aerobic exercise) most days of the week. This may include walking, swimming, or biking. Get at least 30 minutes of exercise that strengthens your muscles (resistance exercise) at least 3 days a week. This may include lifting weights or doing Pilates. Do not smoke or use any products that contain nicotine  or tobacco. If you need help quitting, ask your doctor. Check your blood pressure at home as told by your doctor. Keep all follow-up visits. Medicines Take over-the-counter and prescription medicines only as told by your doctor. Follow directions carefully. Do not skip doses of blood pressure medicine. The medicine does not  work as well if you skip doses. Skipping doses also puts you at risk for problems. Ask your doctor about side effects or reactions to medicines that you should watch for. Contact a doctor if: You think you are having a reaction to the medicine you are taking. You have headaches that keep coming back. You feel dizzy. You have swelling in your ankles. You have trouble with your vision. Get help right away if: You get a very bad headache. You start to feel mixed up (confused). You feel weak or numb. You feel faint. You have very bad pain in your: Chest. Belly (abdomen). You vomit more than once. You have trouble breathing. These symptoms may be an emergency. Get help right away. Call 911. Do not wait to see if the symptoms will go away. Do not drive yourself to the hospital. Summary Hypertension is another name for high blood pressure. High blood pressure forces your heart to work harder to pump blood. For most people, a normal blood pressure is less than 120/80. Making healthy choices can help lower blood pressure. If your blood pressure does not get lower with healthy choices, you may need to take medicine. This information is not intended to replace advice given to you by your health care provider. Make sure you discuss any questions you have with your health care provider. Document Revised: 07/29/2021 Document Reviewed: 07/29/2021 Elsevier Patient Education  2024 [employer].

## 2023-09-15 ENCOUNTER — Other Ambulatory Visit: Payer: Self-pay | Admitting: Nurse Practitioner

## 2023-09-15 DIAGNOSIS — I10 Essential (primary) hypertension: Secondary | ICD-10-CM

## 2023-10-10 ENCOUNTER — Ambulatory Visit (INDEPENDENT_AMBULATORY_CARE_PROVIDER_SITE_OTHER): Payer: 59 | Admitting: Nurse Practitioner

## 2023-10-10 ENCOUNTER — Encounter: Payer: Self-pay | Admitting: Nurse Practitioner

## 2023-10-10 VITALS — BP 150/86 | HR 86 | Temp 97.7°F | Ht 59.0 in | Wt 265.6 lb

## 2023-10-10 DIAGNOSIS — I10 Essential (primary) hypertension: Secondary | ICD-10-CM

## 2023-10-10 MED ORDER — OLMESARTAN MEDOXOMIL 40 MG PO TABS
40.0000 mg | ORAL_TABLET | Freq: Every day | ORAL | 11 refills | Status: DC
Start: 2023-10-10 — End: 2024-03-27

## 2023-10-10 NOTE — Progress Notes (Signed)
Assessment and Plan:   [name] was seen today for follow-up.  Diagnoses and all orders for this visit:  Essential hypertension Stop Losartan and start olmesartan 40 mg every day, continue Ziac 10/6.25 mg every day - continue  DASH diet, exercise and monitor at home. Call if greater than 130/80.   Go to the ER if any chest pain, shortness of breath, nausea, dizziness, severe HA, changes vision/speech  Follow up in 2 weeks with log -     olmesartan (BENICAR) 40 MG tablet; Take 1 tablet (40 mg total) by mouth daily.  Morbid obesity (HCC) Eat more frequently - try not to go more than 6 hours without protein Aim for 90 grams of protein a day- 30 breakfast/30 lunch 30 dinner Try to keep net carbs less than 50 Net Carbs=Total Carbs-fiber- sugar alcohols Exercise heartrate 120-140(fat burning zone)- walking 20-30 minutes 4 days a week     Further disposition pending results of labs. Discussed med's effects and SE's.   Over 30 minutes of exam, counseling, chart review, and critical decision making was performed.   Future Appointments  Date Time Provider Department Center  10/24/2023  3:45 PM Raynelle Dick, NP [facility] None     ------------------------------------------------------------------------------------------------------------------   HPI BP (!) 150/86   Pulse 86   Temp 97.7 F (36.5 C)   Ht 4\' 11"  (1.499 m)   Wt 265 lb 9.6 oz (120.5 kg)   SpO2 99%   BMI 53.64 kg/m  42 y.o.female presents for reevaluation of BP since medication was changed at last visit.  Has not noticed any difference in BP readings with increase of Losartan  Last visit Losartan was increased to 50 mg every day and continue Bisoprolol 10/6.25 mg every day. At home her BP is running 120-150/70's but is using an older wrist cuff.  BP Readings from Last 3 Encounters:  10/10/23 (!) 150/86  09/06/23 (!) 162/94  08/08/23 (!) 162/92  Denies headaches, chest pain, shortness of breath and dizziness    BMI is Body mass index is 53.64 kg/m., she has not been working on diet and exercise. She has been trying to make better food choices.  Wt Readings from Last 3 Encounters:  10/10/23 265 lb 9.6 oz (120.5 kg)  09/06/23 267 lb 9.6 oz (121.4 kg)  08/08/23 263 lb 6.4 oz (119.5 kg)     Past Medical History:  Diagnosis Date   Anemia    Appendicitis 06/22/2011   Asthma    Environmental allergies    GERD (gastroesophageal reflux disease)    Hydradenitis    Hyperlipidemia    Hypertension    Hypothyroidism    Morbid obesity with BMI of 50.0-59.9, adult (HCC)    Right ovarian cyst 06/2023     Allergies  Allergen Reactions   Shellfish Allergy Anaphylaxis   Aspirin     Stomach pain/ burning    Avelox [Moxifloxacin Hcl In Nacl]     hives   Azithromycin     Flu like symptoms   Ibuprofen     Stomach pain    Naproxen Sodium Other (See Comments)    Stomach pain    Current Outpatient Medications on File Prior to Visit  Medication Sig   albuterol (VENTOLIN HFA) 108 (90 Base) MCG/ACT inhaler Inhale 2 puffs into the lungs every 6 (six) hours as needed for wheezing or shortness of breath.   Biotin 2500 MCG CHEW Chew 1 tablet by mouth at bedtime.   bisoprolol-hydrochlorothiazide (ZIAC) 10-6.25 MG tablet TAKE  1 TABLET BY MOUTH EVERY MORNING FOR BLOOD PRESSURE   cetirizine (ZYRTEC) 10 MG tablet Take 10 mg by mouth at bedtime.   Cinnamon (GNP CINNAMON) 500 MG capsule Take 1,000 mg by mouth at bedtime.   doxycycline (VIBRAMYCIN) 100 MG capsule Take 1 capsule Daily with Food for Hidradenitis Suppurativa Prophylaxis   Ferrous Sulfate (IRON) 325 (65 Fe) MG TABS Take 2 tablets by mouth at bedtime.   FIBER ADULT GUMMIES PO Take 1 tablet by mouth at bedtime.   ipratropium-albuterol (DUONEB) 0.5-2.5 (3) MG/3ML SOLN Inhale into the lungs.   montelukast (SINGULAIR) 10 MG tablet TAKE ONE TABLET BY MOUTH DAILY FOR ALLERGIES (Patient taking differently: Take 10 mg by mouth at bedtime. TAKE ONE TABLET BY  MOUTH DAILY FOR ALLERGIES)   MULTIPLE VITAMIN PO Take 1 tablet by mouth at bedtime.   omeprazole (PRILOSEC) 20 MG capsule Take 20 mg by mouth daily.    Probiotic Product (ALIGN DUALBIOTIC PO) Take 1 capsule by mouth at bedtime.   TRI-LO-MARZIA 0.18/0.215/0.25 MG-25 MCG tab TAKE 1 TABLET BY MOUTH DAILY   triamcinolone ointment (KENALOG) 0.5 % Apply 1 application topically as needed.   No current facility-administered medications on file prior to visit.    ROS: all negative except above.   Physical Exam:  BP (!) 150/86   Pulse 86   Temp 97.7 F (36.5 C)   Ht 4\' 11"  (1.499 m)   Wt 265 lb 9.6 oz (120.5 kg)   SpO2 99%   BMI 53.64 kg/m   General Appearance: Very pleasant morbidly obese female , in no apparent distress. Eyes: PERRLA, EOMs, conjunctiva no swelling or erythema Neck: Supple, thyroid normal.  Respiratory: Respiratory effort normal, BS equal bilaterally without rales, rhonchi, wheezing or stridor.  Cardio: RRR with no MRGs. Brisk peripheral pulses without edema.  Abdomen: Soft, + BS.   Lymphatics: Non tender without lymphadenopathy.  Musculoskeletal: Full ROM, 5/5 strength, normal gait.  Skin: Warm, dry without rashes, lesions, ecchymosis.  Neuro: Cranial nerves intact. Normal muscle tone, no cerebellar symptoms. Sensation intact.  Psych: Awake and oriented X 3, normal affect, Insight and Judgment appropriate.     Raynelle Dick, NP 4:09 PM [facility]

## 2023-10-13 ENCOUNTER — Other Ambulatory Visit: Payer: Self-pay | Admitting: Nurse Practitioner

## 2023-10-13 DIAGNOSIS — I10 Essential (primary) hypertension: Secondary | ICD-10-CM

## 2023-10-24 ENCOUNTER — Encounter: Payer: Self-pay | Admitting: Nurse Practitioner

## 2023-10-24 ENCOUNTER — Ambulatory Visit: Payer: 59 | Admitting: Nurse Practitioner

## 2023-10-24 NOTE — Progress Notes (Deleted)
 Assessment and Plan:   [name] was seen today for follow-up.  Diagnoses and all orders for this visit:  Essential hypertension Stop Losartan  and start olmesartan  40 mg every day, continue Ziac  10/6.25 mg every day - continue  DASH diet, exercise and monitor at home. Call if greater than 130/80.   Go to the ER if any chest pain, shortness of breath, nausea, dizziness, severe HA, changes vision/speech  Follow up in 2 weeks with log -     olmesartan  (BENICAR ) 40 MG tablet; Take 1 tablet (40 mg total) by mouth daily.  Morbid obesity (HCC) Eat more frequently - try not to go more than 6 hours without protein Aim for 90 grams of protein a day- 30 breakfast/30 lunch 30 dinner Try to keep net carbs less than 50 Net Carbs=Total Carbs-fiber- sugar alcohols Exercise heartrate 120-140(fat burning zone)- walking 20-30 minutes 4 days a week     Further disposition pending results of labs. Discussed med's effects and SE's.   Over 30 minutes of exam, counseling, chart review, and critical decision making was performed.   Future Appointments  Date Time Provider Department Center  10/26/2023  9:45 AM [name] E, NP [facility] None     ------------------------------------------------------------------------------------------------------------------   HPI There were no vitals taken for this visit. 42 y.o.female presents for reevaluation of BP since medication was changed at last visit.  Has not noticed any difference in BP readings with increase of Losartan   Last visit Losartan  was increased to 50 mg every day and continue Bisoprolol  10/6.25 mg every day. At home her BP is running 120-150/70's but is using an older wrist cuff.  BP Readings from Last 3 Encounters:  10/10/23 (!) 150/86  09/06/23 (!) 162/94  08/08/23 (!) 162/92  Denies headaches, chest pain, shortness of breath and dizziness   BMI is There is no height or weight on file to calculate BMI., she has not been working on diet  and exercise. She has been trying to make better food choices.  Wt Readings from Last 3 Encounters:  10/10/23 265 lb 9.6 oz (120.5 kg)  09/06/23 267 lb 9.6 oz (121.4 kg)  08/08/23 263 lb 6.4 oz (119.5 kg)     Past Medical History:  Diagnosis Date   Anemia    Appendicitis 06/22/2011   Asthma    Environmental allergies    GERD (gastroesophageal reflux disease)    Hydradenitis    Hyperlipidemia    Hypertension    Hypothyroidism    Morbid obesity with BMI of 50.0-59.9, adult (HCC)    Right ovarian cyst 06/2023     Allergies  Allergen Reactions   Shellfish Allergy Anaphylaxis   Aspirin     Stomach pain/ burning    Avelox [Moxifloxacin Hcl In Nacl]     hives   Azithromycin      Flu like symptoms   Ibuprofen     Stomach pain    Naproxen Sodium Other (See Comments)    Stomach pain    Current Outpatient Medications on File Prior to Visit  Medication Sig   albuterol  (VENTOLIN  HFA) 108 (90 Base) MCG/ACT inhaler Inhale 2 puffs into the lungs every 6 (six) hours as needed for wheezing or shortness of breath.   Biotin 2500 MCG CHEW Chew 1 tablet by mouth at bedtime.   bisoprolol -hydrochlorothiazide  (ZIAC ) 10-6.25 MG tablet TAKE 1 TABLET BY MOUTH EVERY MORNING FOR BLOOD PRESSURE   cetirizine (ZYRTEC) 10 MG tablet Take 10 mg by mouth at bedtime.   Cinnamon (GNP  CINNAMON) 500 MG capsule Take 1,000 mg by mouth at bedtime.   doxycycline  (VIBRAMYCIN ) 100 MG capsule Take 1 capsule Daily with Food for Hidradenitis Suppurativa Prophylaxis   Ferrous Sulfate (IRON) 325 (65 Fe) MG TABS Take 2 tablets by mouth at bedtime.   FIBER ADULT GUMMIES PO Take 1 tablet by mouth at bedtime.   ipratropium-albuterol  (DUONEB) 0.5-2.5 (3) MG/3ML SOLN Inhale into the lungs.   montelukast  (SINGULAIR ) 10 MG tablet TAKE ONE TABLET BY MOUTH DAILY FOR ALLERGIES (Patient taking differently: Take 10 mg by mouth at bedtime. TAKE ONE TABLET BY MOUTH DAILY FOR ALLERGIES)   MULTIPLE VITAMIN PO Take 1 tablet by mouth at  bedtime.   olmesartan  (BENICAR ) 40 MG tablet Take 1 tablet (40 mg total) by mouth daily.   omeprazole (PRILOSEC) 20 MG capsule Take 20 mg by mouth daily.    Probiotic Product (ALIGN DUALBIOTIC PO) Take 1 capsule by mouth at bedtime.   TRI-LO-MARZIA 0.18/0.215/0.25 MG-25 MCG tab TAKE 1 TABLET BY MOUTH DAILY   triamcinolone  ointment (KENALOG ) 0.5 % Apply 1 application topically as needed.   No current facility-administered medications on file prior to visit.    ROS: all negative except above.   Physical Exam:  There were no vitals taken for this visit.  General Appearance: Very pleasant morbidly obese female , in no apparent distress. Eyes: PERRLA, EOMs, conjunctiva no swelling or erythema Neck: Supple, thyroid normal.  Respiratory: Respiratory effort normal, BS equal bilaterally without rales, rhonchi, wheezing or stridor.  Cardio: RRR with no MRGs. Brisk peripheral pulses without edema.  Abdomen: Soft, + BS.   Lymphatics: Non tender without lymphadenopathy.  Musculoskeletal: Full ROM, 5/5 strength, normal gait.  Skin: Warm, dry without rashes, lesions, ecchymosis.  Neuro: Cranial nerves intact. Normal muscle tone, no cerebellar symptoms. Sensation intact.  Psych: Awake and oriented X 3, normal affect, Insight and Judgment appropriate.     Amea Mcphail E Tempie Gibeault, NP 2:46 PM [facility]

## 2023-10-26 ENCOUNTER — Ambulatory Visit: Payer: 59 | Admitting: Nurse Practitioner

## 2023-10-28 NOTE — Progress Notes (Signed)
 Assessment and Plan:   [name] was seen today for follow-up.  Diagnoses and all orders for this visit:  Essential hypertension Continue olmesartan  40 mg every day in PM and continue Ziac  10/6.25 mg every day in AM - continue  DASH diet, exercise and monitor at home. Call if greater than 130/80.   Go to the ER if any chest pain, shortness of breath, nausea, dizziness, severe HA, changes vision/speech  Follow up in 3 months for CPE  Morbid obesity (HCC) Eat more frequently - try not to go more than 6 hours without protein Aim for 90 grams of protein a day- 30 breakfast/30 lunch 30 dinner Try to keep net carbs less than 50 Net Carbs=Total Carbs-fiber- sugar alcohols Exercise heartrate 120-140(fat burning zone)- walking 20-30 minutes 4 days a week     Further disposition pending results of labs. Discussed med's effects and SE's.   Over 30 minutes of exam, counseling, chart review, and critical decision making was performed.       ------------------------------------------------------------------------------------------------------------------   HPI BP (!) 142/88   Pulse 90   Temp (!) 97.5 F (36.4 C)   Ht 4' 11 (1.499 m)   Wt 264 lb 9.6 oz (120 kg)   SpO2 99%   BMI 53.44 kg/m  43 y.o.female presents for reevaluation of BP . Her BP has been running in the 120's/70's on olmesartan  40 mg every day in PM and Ziac  10/6.25 mg every day in AM  Last visit Losartan  was increased to 50 mg every day and continue Bisoprolol  10/6.25 mg every day. At home her BP is running 120's/70's but is using an older wrist cuff.  BP Readings from Last 3 Encounters:  10/30/23 (!) 142/88  10/10/23 (!) 150/86  09/06/23 (!) 162/94  Denies headaches, chest pain, shortness of breath and dizziness   BMI is Body mass index is 53.44 kg/m., she has not been working on diet and exercise. She has been trying to make better food choices.  Wt Readings from Last 3 Encounters:  10/30/23 264 lb 9.6 oz (120  kg)  10/10/23 265 lb 9.6 oz (120.5 kg)  09/06/23 267 lb 9.6 oz (121.4 kg)     Past Medical History:  Diagnosis Date   Anemia    Appendicitis 06/22/2011   Asthma    Environmental allergies    GERD (gastroesophageal reflux disease)    Hydradenitis    Hyperlipidemia    Hypertension    Hypothyroidism    Morbid obesity with BMI of 50.0-59.9, adult (HCC)    Right ovarian cyst 06/2023     Allergies  Allergen Reactions   Shellfish Allergy Anaphylaxis   Aspirin     Stomach pain/ burning    Avelox [Moxifloxacin Hcl In Nacl]     hives   Azithromycin      Flu like symptoms   Ibuprofen     Stomach pain    Naproxen Sodium Other (See Comments)    Stomach pain    Current Outpatient Medications on File Prior to Visit  Medication Sig   albuterol  (VENTOLIN  HFA) 108 (90 Base) MCG/ACT inhaler Inhale 2 puffs into the lungs every 6 (six) hours as needed for wheezing or shortness of breath.   Biotin 2500 MCG CHEW Chew 1 tablet by mouth at bedtime.   bisoprolol -hydrochlorothiazide  (ZIAC ) 10-6.25 MG tablet TAKE 1 TABLET BY MOUTH EVERY MORNING FOR BLOOD PRESSURE   cetirizine (ZYRTEC) 10 MG tablet Take 10 mg by mouth at bedtime.   Cinnamon (GNP CINNAMON) 500 MG  capsule Take 1,000 mg by mouth at bedtime.   doxycycline  (VIBRAMYCIN ) 100 MG capsule Take 1 capsule Daily with Food for Hidradenitis Suppurativa Prophylaxis   Ferrous Sulfate (IRON) 325 (65 Fe) MG TABS Take 2 tablets by mouth at bedtime.   FIBER ADULT GUMMIES PO Take 1 tablet by mouth at bedtime.   ipratropium-albuterol  (DUONEB) 0.5-2.5 (3) MG/3ML SOLN Inhale into the lungs.   montelukast  (SINGULAIR ) 10 MG tablet TAKE ONE TABLET BY MOUTH DAILY FOR ALLERGIES (Patient taking differently: Take 10 mg by mouth at bedtime. TAKE ONE TABLET BY MOUTH DAILY FOR ALLERGIES)   MULTIPLE VITAMIN PO Take 1 tablet by mouth at bedtime.   olmesartan  (BENICAR ) 40 MG tablet Take 1 tablet (40 mg total) by mouth daily.   omeprazole (PRILOSEC) 20 MG capsule Take  20 mg by mouth daily.    Probiotic Product (ALIGN DUALBIOTIC PO) Take 1 capsule by mouth at bedtime.   TRI-LO-MARZIA 0.18/0.215/0.25 MG-25 MCG tab TAKE 1 TABLET BY MOUTH DAILY   triamcinolone  ointment (KENALOG ) 0.5 % Apply 1 application topically as needed.   No current facility-administered medications on file prior to visit.    ROS: all negative except above.   Physical Exam:  BP (!) 142/88   Pulse 90   Temp (!) 97.5 F (36.4 C)   Ht 4' 11 (1.499 m)   Wt 264 lb 9.6 oz (120 kg)   SpO2 99%   BMI 53.44 kg/m   General Appearance: Very pleasant morbidly obese female , in no apparent distress. Eyes: PERRLA, EOMs, conjunctiva no swelling or erythema Neck: Supple, thyroid normal.  Respiratory: Respiratory effort normal, BS equal bilaterally without rales, rhonchi, wheezing or stridor.  Cardio: RRR with no MRGs. Brisk peripheral pulses without edema.  Abdomen: Soft, + BS.   Lymphatics: Non tender without lymphadenopathy.  Musculoskeletal: Full ROM, 5/5 strength, normal gait.  Skin: Warm, dry without rashes, lesions, ecchymosis.  Neuro: Cranial nerves intact. Normal muscle tone, no cerebellar symptoms. Sensation intact.  Psych: Awake and oriented X 3, normal affect, Insight and Judgment appropriate.     [name] E [name], NP 3:47 PM [facility]

## 2023-10-30 ENCOUNTER — Ambulatory Visit (INDEPENDENT_AMBULATORY_CARE_PROVIDER_SITE_OTHER): Payer: 59 | Admitting: Nurse Practitioner

## 2023-10-30 ENCOUNTER — Encounter: Payer: Self-pay | Admitting: Nurse Practitioner

## 2023-10-30 DIAGNOSIS — I10 Essential (primary) hypertension: Secondary | ICD-10-CM

## 2023-10-30 NOTE — Patient Instructions (Signed)
 Hypertension, Adult Hypertension is another name for high blood pressure. High blood pressure forces your heart to work harder to pump blood. This can cause problems over time. There are two numbers in a blood pressure reading. There is a top number (systolic) over a bottom number (diastolic). It is best to have a blood pressure that is below 120/80. What are the causes? The cause of this condition is not known. Some other conditions can lead to high blood pressure. What increases the risk? Some lifestyle factors can make you more likely to develop high blood pressure: Smoking. Not getting enough exercise or physical activity. Being overweight. Having too much fat, sugar, calories, or salt (sodium) in your diet. Drinking too much alcohol. Other risk factors include: Having any of these conditions: Heart disease. Diabetes. High cholesterol. Kidney disease. Obstructive sleep apnea. Having a family history of high blood pressure and high cholesterol. Age. The risk increases with age. Stress. What are the signs or symptoms? High blood pressure may not cause symptoms. Very high blood pressure (hypertensive crisis) may cause: Headache. Fast or uneven heartbeats (palpitations). Shortness of breath. Nosebleed. Vomiting or feeling like you may vomit (nauseous). Changes in how you see. Very bad chest pain. Feeling dizzy. Seizures. How is this treated? This condition is treated by making healthy lifestyle changes, such as: Eating healthy foods. Exercising more. Drinking less alcohol. Your doctor may prescribe medicine if lifestyle changes do not help enough and if: Your top number is above 130. Your bottom number is above 80. Your personal target blood pressure may vary. Follow these instructions at home: Eating and drinking  If told, follow the DASH eating plan. To follow this plan: Fill one half of your plate at each meal with fruits and vegetables. Fill one fourth of your plate  at each meal with whole grains. Whole grains include whole-wheat pasta, brown rice, and whole-grain bread. Eat or drink low-fat dairy products, such as skim milk or low-fat yogurt. Fill one fourth of your plate at each meal with low-fat (lean) proteins. Low-fat proteins include fish, chicken without skin, eggs, beans, and tofu. Avoid fatty meat, cured and processed meat, or chicken with skin. Avoid pre-made or processed food. Limit the amount of salt in your diet to less than 1,500 mg each day. Do not drink alcohol if: Your doctor tells you not to drink. You are pregnant, may be pregnant, or are planning to become pregnant. If you drink alcohol: Limit how much you have to: 0-1 drink a day for women. 0-2 drinks a day for men. Know how much alcohol is in your drink. In the [place] one drink equals one 12 oz bottle of beer (355 mL), one 5 oz glass of wine (148 mL), or one 1 oz glass of hard liquor (44 mL). Lifestyle  Work with your doctor to stay at a healthy weight or to lose weight. Ask your doctor what the best weight is for you. Get at least 30 minutes of exercise that causes your heart to beat faster (aerobic exercise) most days of the week. This may include walking, swimming, or biking. Get at least 30 minutes of exercise that strengthens your muscles (resistance exercise) at least 3 days a week. This may include lifting weights or doing Pilates. Do not smoke or use any products that contain nicotine or tobacco. If you need help quitting, ask your doctor. Check your blood pressure at home as told by your doctor. Keep all follow-up visits. Medicines Take over-the-counter and prescription medicines  only as told by your doctor. Follow directions carefully. Do not skip doses of blood pressure medicine. The medicine does not work as well if you skip doses. Skipping doses also puts you at risk for problems. Ask your doctor about side effects or reactions to medicines that you should watch  for. Contact a doctor if: You think you are having a reaction to the medicine you are taking. You have headaches that keep coming back. You feel dizzy. You have swelling in your ankles. You have trouble with your vision. Get help right away if: You get a very bad headache. You start to feel mixed up (confused). You feel weak or numb. You feel faint. You have very bad pain in your: Chest. Belly (abdomen). You vomit more than once. You have trouble breathing. These symptoms may be an emergency. Get help right away. Call 911. Do not wait to see if the symptoms will go away. Do not drive yourself to the hospital. Summary Hypertension is another name for high blood pressure. High blood pressure forces your heart to work harder to pump blood. For most people, a normal blood pressure is less than 120/80. Making healthy choices can help lower blood pressure. If your blood pressure does not get lower with healthy choices, you may need to take medicine. This information is not intended to replace advice given to you by your health care provider. Make sure you discuss any questions you have with your health care provider. Document Revised: 07/29/2021 Document Reviewed: 07/29/2021 Elsevier Patient Education  2024 [employer].

## 2023-11-08 ENCOUNTER — Other Ambulatory Visit: Payer: Self-pay

## 2023-11-08 ENCOUNTER — Other Ambulatory Visit: Payer: Self-pay | Admitting: Internal Medicine

## 2023-11-08 DIAGNOSIS — L732 Hidradenitis suppurativa: Secondary | ICD-10-CM

## 2023-11-08 MED ORDER — DOXYCYCLINE HYCLATE 100 MG PO CAPS
ORAL_CAPSULE | ORAL | 3 refills | Status: DC
Start: 2023-11-08 — End: 2024-03-01

## 2023-11-08 MED ORDER — NORGESTIMATE-ETH ESTRADIOL 0.18/0.215/0.25 MG-25 MCG PO TABS: 1.0000 | ORAL_TABLET | Freq: Every day | ORAL | 0 refills | Status: DC

## 2023-11-08 MED ORDER — NORGESTIMATE-ETH ESTRADIOL 0.18/0.215/0.25 MG-25 MCG PO TABS: 1.0000 | ORAL_TABLET | Freq: Every day | ORAL | 3 refills | Status: DC

## 2023-11-11 ENCOUNTER — Other Ambulatory Visit: Payer: Self-pay | Admitting: Nurse Practitioner

## 2023-11-11 DIAGNOSIS — I10 Essential (primary) hypertension: Secondary | ICD-10-CM

## 2023-12-06 ENCOUNTER — Encounter: Payer: Self-pay | Admitting: Internal Medicine

## 2024-02-02 ENCOUNTER — Encounter: Payer: 59 | Admitting: Nurse Practitioner

## 2024-03-01 ENCOUNTER — Telehealth: Payer: Self-pay

## 2024-03-01 ENCOUNTER — Other Ambulatory Visit: Payer: Self-pay

## 2024-03-01 DIAGNOSIS — L732 Hidradenitis suppurativa: Secondary | ICD-10-CM

## 2024-03-01 NOTE — Telephone Encounter (Signed)
 Copied from CRM [id number]. Topic: Appointments - Transfer of Care >> Mar 01, 2024 12:37 PM Rosamond Comes wrote: Pt is requesting to transfer FROM: Dr Vangie Genet Pt is requesting to transfer TO: Joe Murders  NP Reason for requested transfer: Provider passed It is the responsibility of the team the patient would like to transfer to Joe Murders  NP) to reach out to the patient if for any reason this transfer is not acceptable.

## 2024-03-01 NOTE — Telephone Encounter (Signed)
 This RN called the pt to discuss a medication refill. Pt is scheduled for a new pt office visit 04/17/2024 at [facility]. Pt did not answer. RN LVM with a callback number. RN pended medication for refill before new pt office visit in June.   Copied from CRM [id number]. Topic: Appointments - Appointment Scheduling >> Mar 01, 2024 12:42 PM [name] wrote: Patient/patient representative is calling to schedule an appointment. Refer to attachments for appointment information.  Patient calling to establish care with new provider   Patient has 6 pills left of doxycycline  (VIBRAMYCIN ) 100 MG capsule and will need a refill before appt    Patient would like a call regarding medication refills Phone [phone]

## 2024-03-03 ENCOUNTER — Other Ambulatory Visit: Payer: Self-pay | Admitting: Family

## 2024-03-03 DIAGNOSIS — L732 Hidradenitis suppurativa: Secondary | ICD-10-CM

## 2024-03-03 MED ORDER — DOXYCYCLINE HYCLATE 100 MG PO CAPS
ORAL_CAPSULE | ORAL | 3 refills | Status: DC
Start: 2024-03-03 — End: 2024-07-10

## 2024-03-03 MED ORDER — DOXYCYCLINE HYCLATE 100 MG PO CAPS
ORAL_CAPSULE | ORAL | 3 refills | Status: DC
Start: 2024-03-03 — End: 2024-03-03

## 2024-03-04 NOTE — Telephone Encounter (Signed)
 Looks like pt is already scheduled with B. Deborra Falter?  If she would still like to [facility] to me this is ok but just clarify.

## 2024-03-21 ENCOUNTER — Encounter: Payer: Self-pay | Admitting: General Practice

## 2024-03-22 NOTE — Telephone Encounter (Signed)
 Called patient and moved appt up to 03/27/24 at 10:20 am.

## 2024-03-27 ENCOUNTER — Ambulatory Visit (INDEPENDENT_AMBULATORY_CARE_PROVIDER_SITE_OTHER): Payer: Self-pay | Admitting: General Practice

## 2024-03-27 ENCOUNTER — Ambulatory Visit: Payer: Self-pay | Admitting: General Practice

## 2024-03-27 ENCOUNTER — Encounter: Payer: Self-pay | Admitting: General Practice

## 2024-03-27 VITALS — BP 134/82 | HR 89 | Temp 98.0°F | Ht <= 58 in | Wt 270.0 lb

## 2024-03-27 DIAGNOSIS — E785 Hyperlipidemia, unspecified: Secondary | ICD-10-CM

## 2024-03-27 DIAGNOSIS — J4520 Mild intermittent asthma, uncomplicated: Secondary | ICD-10-CM | POA: Diagnosis not present

## 2024-03-27 DIAGNOSIS — Z23 Encounter for immunization: Secondary | ICD-10-CM

## 2024-03-27 DIAGNOSIS — Z862 Personal history of diseases of the blood and blood-forming organs and certain disorders involving the immune mechanism: Secondary | ICD-10-CM | POA: Insufficient documentation

## 2024-03-27 DIAGNOSIS — I10 Essential (primary) hypertension: Secondary | ICD-10-CM | POA: Diagnosis not present

## 2024-03-27 DIAGNOSIS — Z9109 Other allergy status, other than to drugs and biological substances: Secondary | ICD-10-CM | POA: Insufficient documentation

## 2024-03-27 DIAGNOSIS — Z7689 Persons encountering health services in other specified circumstances: Secondary | ICD-10-CM | POA: Insufficient documentation

## 2024-03-27 LAB — COMPREHENSIVE METABOLIC PANEL WITH GFR
ALT: 55 U/L — ABNORMAL HIGH (ref 0–35)
AST: 79 U/L — ABNORMAL HIGH (ref 0–37)
Albumin: 4.2 g/dL (ref 3.5–5.2)
Alkaline Phosphatase: 77 U/L (ref 39–117)
BUN: 11 mg/dL (ref 6–23)
CO2: 28 meq/L (ref 19–32)
Calcium: 9.5 mg/dL (ref 8.4–10.5)
Chloride: 99 meq/L (ref 96–112)
Creatinine, Ser: 0.66 mg/dL (ref 0.40–1.20)
GFR: 107.99 mL/min (ref >60.00)
Glucose, Bld: 99 mg/dL (ref 70–99)
Potassium: 4.3 meq/L (ref 3.5–5.1)
Sodium: 137 meq/L (ref 135–145)
Total Bilirubin: 0.5 mg/dL (ref 0.2–1.2)
Total Protein: 7 g/dL (ref 6.0–8.3)

## 2024-03-27 LAB — HEMOGLOBIN A1C: Hgb A1c MFr Bld: 6.5 % (ref 4.6–6.5)

## 2024-03-27 LAB — CBC
HCT: 39.4 % (ref 36.0–46.0)
Hemoglobin: 13.1 g/dL (ref 12.0–15.0)
MCHC: 33.3 g/dL (ref 30.0–36.0)
MCV: 89.6 fl (ref 78.0–100.0)
Platelets: 302 10*3/uL (ref 150.0–400.0)
RBC: 4.4 Mil/uL (ref 3.87–5.11)
RDW: 13.4 % (ref 11.5–15.5)
WBC: 8.5 10*3/uL (ref 4.0–10.5)

## 2024-03-27 MED ORDER — MONTELUKAST SODIUM 10 MG PO TABS
ORAL_TABLET | ORAL | 0 refills | Status: DC
Start: 2024-03-27 — End: 2024-06-21

## 2024-03-27 MED ORDER — OLMESARTAN MEDOXOMIL 40 MG PO TABS
40.0000 mg | ORAL_TABLET | Freq: Every day | ORAL | 0 refills | Status: DC
Start: 2024-03-27 — End: 2024-06-21

## 2024-03-27 MED ORDER — BISOPROLOL-HYDROCHLOROTHIAZIDE 10-6.25 MG PO TABS
ORAL_TABLET | ORAL | 0 refills | Status: DC
Start: 2024-03-27 — End: 2024-06-21

## 2024-03-27 NOTE — Assessment & Plan Note (Signed)
 Controlled but slightly above goal.  Did run out of medication for the last two days.   Continue Olmesartan  40 mg once daily and Bisoprolol -hydrochlorothiazide  10-6.25 mg once daily.  Refill sent for both.   Cmp pending.

## 2024-03-27 NOTE — Progress Notes (Signed)
 New Patient Office Visit  Subjective    Patient ID: [name], female    DOB: 1981-10-08  Age: 43 y.o. MRN: [id number]  CC:  Chief Complaint  Patient presents with   New Patient (Initial Visit)    Needs medications refilled    HPI [name] is a 43 y.o. female presents to establish care.  Previous PCP/physical/labs: [place]/ no recent physical/ labs in September at the hospital  Environmental allergies/Asthma: diagnosed many years ago. Currently managed on Singular 10 mg once daily. She is out of her medication and needs refill.   HTN: diagnosed many years ago. Currently managed on Bisoprolol -hydrochlorothiazide  10-6.25 mg once daily and olmesartan  40 mg once daily. The home BP readings have been in the 130's / 70s-80's range. She denies blurred vision, headaches, chest pain, shortness of breath.   Asthma: diagnosed in her 72s. Uses albuterol  inhaler as needed. Does have the nebulizer to use when she is sick.   History of anemia: currently managed on ferrous sulfate 65 fe twice daily. Denies any constipation. Last hemoglobin was 13.8 on 07/05/23.   Outpatient Encounter Medications as of 03/27/2024  Medication Sig   albuterol  (VENTOLIN  HFA) 108 (90 Base) MCG/ACT inhaler Inhale 2 puffs into the lungs every 6 (six) hours as needed for wheezing or shortness of breath.   Biotin 2500 MCG CHEW Chew 1 tablet by mouth at bedtime.   cetirizine (ZYRTEC) 10 MG tablet Take 10 mg by mouth at bedtime.   doxycycline  (VIBRAMYCIN ) 100 MG capsule Take 1 capsule Daily with Food for Hidradenitis Suppurativa Prophylaxis   Ferrous Sulfate (IRON) 325 (65 Fe) MG TABS Take 2 tablets by mouth at bedtime.   FIBER ADULT GUMMIES PO Take 1 tablet by mouth at bedtime.   ipratropium-albuterol  (DUONEB) 0.5-2.5 (3) MG/3ML SOLN Inhale into the lungs.   MULTIPLE VITAMIN PO Take 1 tablet by mouth at bedtime.   Norgestimate -Eth Estradiol  (TRI-LO-MARZIA) 0.18/0.215/0.25 MG-25 MCG TABS Take 1 tablet by mouth daily.    omeprazole (PRILOSEC) 20 MG capsule Take 20 mg by mouth daily.    Probiotic Product (ALIGN DUALBIOTIC PO) Take 1 capsule by mouth at bedtime.   triamcinolone  ointment (KENALOG ) 0.5 % Apply 1 application topically as needed.   [DISCONTINUED] bisoprolol -hydrochlorothiazide  (ZIAC ) 10-6.25 MG tablet TAKE 1 TABLET BY MOUTH EVERY MORNING FOR BLOOD PRESSURE   [DISCONTINUED] montelukast  (SINGULAIR ) 10 MG tablet TAKE ONE TABLET BY MOUTH DAILY FOR ALLERGIES (Patient taking differently: Take 10 mg by mouth at bedtime. TAKE ONE TABLET BY MOUTH DAILY FOR ALLERGIES)   [DISCONTINUED] olmesartan  (BENICAR ) 40 MG tablet Take 1 tablet (40 mg total) by mouth daily.   bisoprolol -hydrochlorothiazide  (ZIAC ) 10-6.25 MG tablet TAKE 1 TABLET BY MOUTH EVERY MORNING FOR BLOOD PRESSURE   montelukast  (SINGULAIR ) 10 MG tablet TAKE ONE TABLET BY MOUTH DAILY FOR ALLERGIES   olmesartan  (BENICAR ) 40 MG tablet Take 1 tablet (40 mg total) by mouth daily.   [DISCONTINUED] Cinnamon (GNP CINNAMON) 500 MG capsule Take 1,000 mg by mouth at bedtime.   No facility-administered encounter medications on file as of 03/27/2024.    Past Medical History:  Diagnosis Date   Anemia    Appendicitis 06/22/2011   Asthma    Environmental allergies    GERD (gastroesophageal reflux disease)    Hydradenitis    Hyperlipidemia    Hypertension    Hypothyroidism    Morbid obesity with BMI of 50.0-59.9, adult (HCC)    Right ovarian cyst 06/2023    Past Surgical History:  Procedure Laterality  Date   APPENDECTOMY  05/24/2011   TYMPANOSTOMY TUBE PLACEMENT Bilateral 1989    History reviewed. No pertinent family history.  Social History   Socioeconomic History   Marital status: Single    Spouse name: Not on file   Number of children: 0   Years of education: Not on file   Highest education level: Some college, no degree  Occupational History   Not on file  Tobacco Use   Smoking status: Never   Smokeless tobacco: Never  Vaping Use    Vaping status: Never Used  Substance and Sexual Activity   Alcohol use: No   Drug use: No   Sexual activity: Not on file  Other Topics Concern   Not on file  Social History Narrative   Lives with parents   Social Drivers of Health   Financial Resource Strain: Medium Risk (03/26/2024)   Overall Financial Resource Strain (CARDIA)    Difficulty of Paying Living Expenses: Somewhat hard  Food Insecurity: No Food Insecurity (03/26/2024)   Hunger Vital Sign    Worried About Running Out of Food in the Last Year: Never true    Ran Out of Food in the Last Year: Never true  Transportation Needs: No Transportation Needs (03/26/2024)   PRAPARE - Administrator, Civil Service (Medical): No    Lack of Transportation (Non-Medical): No  Physical Activity: Unknown (03/26/2024)   Exercise Vital Sign    Days of Exercise per Week: 0 days    Minutes of Exercise per Session: Not on file  Stress: No Stress Concern Present (03/26/2024)   [employer] of Occupational Health - Occupational Stress Questionnaire    Feeling of Stress : Not at all  Social Connections: Moderately Integrated (03/26/2024)   Social Connection and Isolation Panel [NHANES]    Frequency of Communication with Friends and Family: More than three times a week    Frequency of Social Gatherings with Friends and Family: Once a week    Attends Religious Services: More than 4 times per year    Active Member of [employer] or Organizations: Yes    Attends Engineer, structural: More than 4 times per year    Marital Status: Never married  Intimate Partner Violence: Not on file    Review of Systems  Constitutional:  Negative for chills and fever.  Respiratory:  Negative for shortness of breath.   Cardiovascular:  Negative for chest pain.  Gastrointestinal:  Negative for abdominal pain, constipation, diarrhea, heartburn, nausea and vomiting.  Genitourinary:  Negative for dysuria, frequency and urgency.  Neurological:  Negative  for dizziness and headaches.  Endo/Heme/Allergies:  Negative for polydipsia.  Psychiatric/Behavioral:  Negative for depression and suicidal ideas. The patient is not nervous/anxious.         Objective    BP 134/82 (BP Location: Left Arm, Patient Position: Sitting, Cuff Size: Large)   Pulse 89   Temp 98 F (36.7 C) (Oral)   Ht 4' 9.9" (1.471 m)   Wt 270 lb (122.5 kg)   SpO2 98%   BMI 56.63 kg/m   Physical Exam Vitals and nursing note reviewed.  Constitutional:      Appearance: Normal appearance.  Cardiovascular:     Rate and Rhythm: Normal rate and regular rhythm.     Pulses: Normal pulses.     Heart sounds: Normal heart sounds.  Pulmonary:     Effort: Pulmonary effort is normal.     Breath sounds: Normal breath sounds.  Neurological:  Mental Status: She is alert and oriented to person, place, and time.  Psychiatric:        Mood and Affect: Mood normal.        Behavior: Behavior normal.        Thought Content: Thought content normal.        Judgment: Judgment normal.         Assessment & Plan:  Hypertension, unspecified type Assessment & Plan: Controlled but slightly above goal.  Did run out of medication for the last two days.   Continue Olmesartan  40 mg once daily and Bisoprolol -hydrochlorothiazide  10-6.25 mg once daily.  Refill sent for both.   Cmp pending.  Orders: -     Bisoprolol -hydroCHLOROthiazide ; TAKE 1 TABLET BY MOUTH EVERY MORNING FOR BLOOD PRESSURE  Dispense: 90 tablet; Refill: 0 -     Montelukast  Sodium; TAKE ONE TABLET BY MOUTH DAILY FOR ALLERGIES  Dispense: 90 tablet; Refill: 0 -     Olmesartan  Medoxomil; Take 1 tablet (40 mg total) by mouth daily.  Dispense: 90 tablet; Refill: 0  Establishing care with new doctor, encounter for Assessment & Plan: EMR reviewed briefly.    Morbid obesity (HCC) Assessment & Plan: Discussed the importance of healthy diet and exercise to affect sustainable weight loss.    Orders: -     Hemoglobin  A1c  Environmental allergies Assessment & Plan: Controlled.   Continue Flonase, Singular 10 mg once daily and Zyrtec.   Refill sent for Singulair .   Orders: -     Montelukast  Sodium; TAKE ONE TABLET BY MOUTH DAILY FOR ALLERGIES  Dispense: 90 tablet; Refill: 0  Essential hypertension Assessment & Plan: Controlled but slightly above goal.  Did run out of medication for the last two days.   Continue Olmesartan  40 mg once daily and Bisoprolol -hydrochlorothiazide  10-6.25 mg once daily.  Refill sent for both.   Cmp pending.  Orders: -     CBC -     Comprehensive metabolic panel with GFR -     Hemoglobin A1c  Mild intermittent asthma without complication Assessment & Plan: Controlled.   Continue Albuterol  inhaler and nebulizer as needed.  No refill needed at this time.   Hyperlipidemia, unspecified hyperlipidemia type Assessment & Plan: In her 2s but lipid panel hasn't been checked in over 10 years.   She is not fasting today.   Will check at next visit.  Orders: -     CBC -     Comprehensive metabolic panel with GFR -     Hemoglobin A1c  History of anemia Assessment & Plan: Reviewed labs in care everywhere.   Cbc pending.  Orders: -     CBC -     Comprehensive metabolic panel with GFR -     Hemoglobin A1c  Need for Tdap vaccination -     Tdap vaccine greater than or equal to 7yo IM     Return in about 3 weeks (around 04/17/2024), or physical and fasting cholesterol panel.   Jolanda Nation, NP

## 2024-03-27 NOTE — Assessment & Plan Note (Signed)
 Controlled.   Continue Albuterol  inhaler and nebulizer as needed.  No refill needed at this time.

## 2024-03-27 NOTE — Assessment & Plan Note (Signed)
 EMR reviewed briefly.

## 2024-03-27 NOTE — Assessment & Plan Note (Signed)
 Discussed the importance of healthy diet and exercise to affect sustainable weight loss.

## 2024-03-27 NOTE — Assessment & Plan Note (Signed)
 In her 74s but lipid panel hasn't been checked in over 10 years.   She is not fasting today.   Will check at next visit.

## 2024-03-27 NOTE — Assessment & Plan Note (Signed)
 Reviewed labs in care everywhere.   Cbc pending.

## 2024-03-27 NOTE — Assessment & Plan Note (Signed)
 Controlled.   Continue Flonase, Singular 10 mg once daily and Zyrtec.   Refill sent for Singulair .

## 2024-03-27 NOTE — Patient Instructions (Addendum)
 Stop by the lab prior to leaving today. I will notify you of your results once received.   Resume medications. All sent to the pharmacy.   Schedule PAP SMEAR with GYN; ask about mammogram.   Consider pneumonia vaccine- Prevnar 20 is the name of the vaccine.   Follow up in 3 weeks for physical and fasting due to the cholesterol panel.   It was a pleasure to meet you today! Please don't hesitate to contact me with any questions. Welcome to [facility]!

## 2024-04-01 NOTE — Telephone Encounter (Signed)
 Called pt and schedule appt

## 2024-04-17 ENCOUNTER — Ambulatory Visit: Payer: Self-pay | Admitting: General Practice

## 2024-04-18 ENCOUNTER — Encounter: Payer: Self-pay | Admitting: General Practice

## 2024-04-18 ENCOUNTER — Ambulatory Visit: Payer: Self-pay | Admitting: General Practice

## 2024-04-18 ENCOUNTER — Ambulatory Visit (INDEPENDENT_AMBULATORY_CARE_PROVIDER_SITE_OTHER): Admitting: General Practice

## 2024-04-18 VITALS — BP 128/80 | HR 86 | Temp 97.7°F | Ht <= 58 in | Wt 266.0 lb

## 2024-04-18 DIAGNOSIS — I10 Essential (primary) hypertension: Secondary | ICD-10-CM

## 2024-04-18 DIAGNOSIS — E1165 Type 2 diabetes mellitus with hyperglycemia: Secondary | ICD-10-CM | POA: Diagnosis not present

## 2024-04-18 DIAGNOSIS — R21 Rash and other nonspecific skin eruption: Secondary | ICD-10-CM | POA: Insufficient documentation

## 2024-04-18 DIAGNOSIS — Z0000 Encounter for general adult medical examination without abnormal findings: Secondary | ICD-10-CM | POA: Insufficient documentation

## 2024-04-18 DIAGNOSIS — Z23 Encounter for immunization: Secondary | ICD-10-CM | POA: Diagnosis not present

## 2024-04-18 DIAGNOSIS — Z0001 Encounter for general adult medical examination with abnormal findings: Secondary | ICD-10-CM

## 2024-04-18 DIAGNOSIS — Z1159 Encounter for screening for other viral diseases: Secondary | ICD-10-CM

## 2024-04-18 DIAGNOSIS — Z114 Encounter for screening for human immunodeficiency virus [HIV]: Secondary | ICD-10-CM

## 2024-04-18 DIAGNOSIS — R809 Proteinuria, unspecified: Secondary | ICD-10-CM

## 2024-04-18 DIAGNOSIS — Z30018 Encounter for initial prescription of other contraceptives: Secondary | ICD-10-CM | POA: Insufficient documentation

## 2024-04-18 DIAGNOSIS — E785 Hyperlipidemia, unspecified: Secondary | ICD-10-CM

## 2024-04-18 DIAGNOSIS — Z862 Personal history of diseases of the blood and blood-forming organs and certain disorders involving the immune mechanism: Secondary | ICD-10-CM

## 2024-04-18 LAB — MICROALBUMIN / CREATININE URINE RATIO
Creatinine,U: 53.5 mg/dL
Microalb Creat Ratio: 51.7 mg/g — ABNORMAL HIGH (ref 0.0–30.0)
Microalb, Ur: 2.8 mg/dL — ABNORMAL HIGH (ref 0.0–1.9)

## 2024-04-18 LAB — LIPID PANEL
Cholesterol: 206 mg/dL — ABNORMAL HIGH (ref 0–200)
HDL: 52.6 mg/dL (ref >39.00)
LDL Cholesterol: 105 mg/dL — ABNORMAL HIGH (ref 0–99)
NonHDL: 153.74
Total CHOL/HDL Ratio: 4
Triglycerides: 246 mg/dL — ABNORMAL HIGH (ref 0.0–149.0)
VLDL: 49.2 mg/dL — ABNORMAL HIGH (ref 0.0–40.0)

## 2024-04-18 MED ORDER — NORGESTIMATE-ETH ESTRADIOL 0.18/0.215/0.25 MG-25 MCG PO TABS
1.0000 | ORAL_TABLET | Freq: Every day | ORAL | 3 refills | Status: AC
Start: 2024-04-18 — End: ?

## 2024-04-18 MED ORDER — TRIAMCINOLONE ACETONIDE 0.5 % EX OINT: 1.0000 | TOPICAL_OINTMENT | CUTANEOUS | 1 refills | Status: AC | PRN

## 2024-04-18 NOTE — Assessment & Plan Note (Signed)
 Controlled.

## 2024-04-18 NOTE — Patient Instructions (Addendum)
 Stop by the lab prior to leaving today. I will notify you of your results once received.   Schedule diabetic eye exam .  Schedule GYN appt for pap smear.   Follow up as scheduled.   It was a pleasure to see you today!

## 2024-04-18 NOTE — Assessment & Plan Note (Signed)
 Controlled. At goal.   Continue Olmesartan  40 mg once daily and Bisoprolol -hydrochlorothiazide  10-6.25 mg once daily.   F/u in 6 months.

## 2024-04-18 NOTE — Assessment & Plan Note (Signed)
 Long discussion about weight loss, diet, and exercise Recommended diet heavy in fruits and veggies and low in animal meats, cheeses, and dairy products, appropriate calorie intake Patient will work on decreasing saturated fats and simple carbs  Increase lean proteins and activity.

## 2024-04-18 NOTE — Assessment & Plan Note (Signed)
 Chronic.  Refill sent for Triamcinolone  5% to apply topically as needed.

## 2024-04-18 NOTE — Assessment & Plan Note (Signed)
 Controlled.  Following with gyn.  Refill sent for birth control.

## 2024-04-18 NOTE — Progress Notes (Signed)
 Established Patient Office Visit  Subjective   Patient ID: [name], female    DOB: 1981-01-23  Age: 43 y.o. MRN: [id number]  Chief Complaint  Patient presents with   Annual Exam    HPI  [name] is a 43 year old female with past medical history of HTN, asthma, obesity, HLD, anemia presents today for complete physical and follow up of chronic conditions.  Immunizations: -Tetanus: Completed in 2025 -Pneumonia: due   Diet: Fair diet.  Exercise: No regular exercise.  Eye exam: Completed two years ago.  Dental exam: Completes semi-annually    Pap Smear: due; patient will schedule with GYN.  Mammogram: due   Patient Active Problem List   Diagnosis Date Noted   Encounter for screening and preventative care 04/18/2024   Type 2 diabetes mellitus with hyperglycemia (HCC) 04/18/2024   Hormonal contraceptive 04/18/2024   Rash and nonspecific skin eruption 04/18/2024   Environmental allergies 03/27/2024   Mild intermittent asthma without complication 03/27/2024   History of anemia 03/27/2024   Abnormal TSH 01/08/2022   Cellulitis of right abdominal wall 10/05/2021   Hyperlipidemia    Essential hypertension 09/13/2018   Morbid obesity (HCC) 09/13/2018   Past Medical History:  Diagnosis Date   Anemia    Appendicitis 06/22/2011   Asthma    Environmental allergies    GERD (gastroesophageal reflux disease)    Hydradenitis    Hyperlipidemia    Hypertension    Hypothyroidism    Morbid obesity with BMI of 50.0-59.9, adult (HCC)    Right ovarian cyst 06/2023   Past Surgical History:  Procedure Laterality Date   APPENDECTOMY  05/24/2011   TYMPANOSTOMY TUBE PLACEMENT Bilateral 1989   Allergies  Allergen Reactions   Shellfish Allergy Anaphylaxis   Aspirin     Stomach pain/ burning    Avelox [Moxifloxacin Hcl In Nacl]     hives   Azithromycin      Flu like symptoms   Ibuprofen     Stomach pain    Naproxen Sodium Other (See Comments)    Stomach pain          04/18/2024    8:11 AM 03/27/2024   10:16 AM  Depression screen PHQ 2/9  Decreased Interest 0 0  Down, Depressed, Hopeless 0 0  PHQ - 2 Score 0 0  Altered sleeping 1 1  Tired, decreased energy 0 1  Change in appetite 0 0  Feeling bad or failure about yourself  0 0  Trouble concentrating 0 0  Moving slowly or fidgety/restless 0 0  Suicidal thoughts 0 0  PHQ-9 Score 1 2  Difficult doing work/chores Not difficult at all Not difficult at all       04/18/2024    8:11 AM 03/27/2024   10:16 AM  GAD 7 : Generalized Anxiety Score  Nervous, Anxious, on Edge 0 0  Control/stop worrying 0 0  Worry too much - different things 0 0  Trouble relaxing 0 0  Restless 0 0  Easily annoyed or irritable 0 1  Afraid - awful might happen 0 1  Total GAD 7 Score 0 2  Anxiety Difficulty Not difficult at all Not difficult at all      Review of Systems  Constitutional:  Negative for chills, fever, malaise/fatigue and weight loss.  HENT:  Negative for congestion, ear discharge, ear pain, hearing loss, nosebleeds, sinus pain, sore throat and tinnitus.   Eyes:  Negative for blurred vision, double vision, pain, discharge and redness.  Respiratory:  Negative for cough, shortness of breath, wheezing and stridor.   Cardiovascular:  Negative for chest pain, palpitations and leg swelling.  Gastrointestinal:  Negative for abdominal pain, constipation, diarrhea, heartburn, nausea and vomiting.  Genitourinary:  Negative for dysuria, frequency and urgency.  Musculoskeletal:  Negative for myalgias.  Skin:  Negative for rash.  Neurological:  Negative for dizziness, tingling, seizures, weakness and headaches.  Psychiatric/Behavioral:  Negative for depression, substance abuse and suicidal ideas. The patient is not nervous/anxious.       Objective:     BP 128/80   Pulse 86   Temp 97.7 F (36.5 C) (Temporal)   Ht 4' 9.9 (1.471 m)   Wt 266 lb (120.7 kg)   SpO2 97%   BMI 55.79 kg/m  BP Readings from Last  3 Encounters:  04/18/24 128/80  03/27/24 134/82  10/30/23 (!) 142/88   Wt Readings from Last 3 Encounters:  04/18/24 266 lb (120.7 kg)  03/27/24 270 lb (122.5 kg)  10/30/23 264 lb 9.6 oz (120 kg)      Physical Exam Vitals and nursing note reviewed.  Constitutional:      Appearance: Normal appearance.  HENT:     Head: Normocephalic and atraumatic.     Right Ear: Tympanic membrane, ear canal and external ear normal.     Left Ear: Tympanic membrane, ear canal and external ear normal.     Nose: Nose normal.     Mouth/Throat:     Mouth: Mucous membranes are moist.     Pharynx: Oropharynx is clear.   Eyes:     Conjunctiva/sclera: Conjunctivae normal.     Pupils: Pupils are equal, round, and reactive to light.    Cardiovascular:     Rate and Rhythm: Normal rate and regular rhythm.     Pulses: Normal pulses.     Heart sounds: Normal heart sounds.  Pulmonary:     Effort: Pulmonary effort is normal.     Breath sounds: Normal breath sounds.  Abdominal:     General: Abdomen is flat. Bowel sounds are normal.     Palpations: Abdomen is soft.   Musculoskeletal:        General: Normal range of motion.     Cervical back: Normal range of motion.   Skin:    General: Skin is warm and dry.     Capillary Refill: Capillary refill takes less than 2 seconds.   Neurological:     General: No focal deficit present.     Mental Status: She is alert and oriented to person, place, and time. Mental status is at baseline.   Psychiatric:        Mood and Affect: Mood normal.        Behavior: Behavior normal.        Thought Content: Thought content normal.        Judgment: Judgment normal.      No results found for any visits on 04/18/24.     The ASCVD Risk score (Arnett DK, et al., 2019) failed to calculate for the following reasons:   Cannot find a previous HDL lab   Cannot find a previous total cholesterol lab    Assessment & Plan:  Encounter for screening and preventative  care Assessment & Plan: Immunizations UTD. Prevnar 20 given today. Pap smear due; she will schedule with GYN. Mammogram due, orders placed.appt scheduled.  Discussed the importance of a healthy diet and regular exercise in order for weight loss, and to reduce the  risk of further co-morbidity.  Exam stable. Labs pending.  Follow up in 1 year for repeat physical.   Orders: -     Lipid panel  Type 2 diabetes mellitus with hyperglycemia, unspecified whether long term insulin use (HCC) Assessment & Plan: Uncontrolled.  Hemoglobin A1c 6.5 on 03/27/24.  Foot exam completed today.  Prevnar 20 given today. Urine acr pending.  Consider statin for CVD protection.  Discussed the importance of monitoring diet and exercise. Discussed diabetic retinopathy, proteinuria and foot care.  She declines medication and nutrition referral at this time.   F/u in 6 months.  Orders: -     Microalbumin / creatinine urine ratio  Hormonal contraceptive Assessment & Plan: Controlled.  Following with gyn.  Refill sent for birth control.   Orders: -     Norgestimate -Eth Estradiol ; Take 1 tablet by mouth daily.  Dispense: 84 tablet; Refill: 3  Need for pneumococcal 20-valent conjugate vaccination -     Pneumococcal conjugate vaccine 20-valent  Need for hepatitis C screening test -     Hepatitis C antibody  Screening for HIV (human immunodeficiency virus) -     HIV Antibody (routine testing w rflx)  Rash and nonspecific skin eruption Assessment & Plan: Chronic.  Refill sent for Triamcinolone  5% to apply topically as needed.  Orders: -     Triamcinolone  Acetonide; Apply 1 Application topically as needed.  Dispense: 15 g; Refill: 1  Essential hypertension Assessment & Plan: Controlled. At goal.   Continue Olmesartan  40 mg once daily and Bisoprolol -hydrochlorothiazide  10-6.25 mg once daily.   F/u in 6 months.   History of anemia Assessment & Plan: Controlled.   Morbid obesity  (HCC) Assessment & Plan: Long discussion about weight loss, diet, and exercise Recommended diet heavy in fruits and veggies and low in animal meats, cheeses, and dairy products, appropriate calorie intake Patient will work on decreasing saturated fats and simple carbs  Increase lean proteins and activity.      Return for already scheduled.SABRA Carrol Aurora, NP

## 2024-04-18 NOTE — Assessment & Plan Note (Signed)
 Immunizations UTD. Prevnar 20 given today. Pap smear due; she will schedule with GYN. Mammogram due, orders placed.appt scheduled.  Discussed the importance of a healthy diet and regular exercise in order for weight loss, and to reduce the risk of further co-morbidity.  Exam stable. Labs pending.  Follow up in 1 year for repeat physical.

## 2024-04-18 NOTE — Assessment & Plan Note (Addendum)
 Uncontrolled.  Hemoglobin A1c 6.5 on 03/27/24.  Foot exam completed today.  Prevnar 20 given today. Urine acr pending.  Consider statin for CVD protection.  Discussed the importance of monitoring diet and exercise. Discussed diabetic retinopathy, proteinuria and foot care.  She declines medication and nutrition referral at this time.   F/u in 6 months.

## 2024-04-19 ENCOUNTER — Other Ambulatory Visit: Payer: Self-pay | Admitting: General Practice

## 2024-04-19 DIAGNOSIS — R809 Proteinuria, unspecified: Secondary | ICD-10-CM

## 2024-04-19 LAB — HIV ANTIBODY (ROUTINE TESTING W REFLEX): HIV 1&2 Ab, 4th Generation: NONREACTIVE

## 2024-04-19 LAB — HEPATITIS C ANTIBODY: Hepatitis C Ab: NONREACTIVE

## 2024-04-19 MED ORDER — DAPAGLIFLOZIN PROPANEDIOL 5 MG PO TABS: 5.0000 mg | ORAL_TABLET | Freq: Every day | ORAL | 0 refills | Status: DC

## 2024-04-19 MED ORDER — EMPAGLIFLOZIN 10 MG PO TABS: 10.0000 mg | ORAL_TABLET | Freq: Every day | ORAL | 0 refills | Status: DC

## 2024-04-19 MED ORDER — ROSUVASTATIN CALCIUM 5 MG PO TABS: 5.0000 mg | ORAL_TABLET | Freq: Every day | ORAL | 1 refills | Status: DC

## 2024-04-25 ENCOUNTER — Other Ambulatory Visit: Payer: Self-pay | Admitting: General Practice

## 2024-04-25 DIAGNOSIS — Z1231 Encounter for screening mammogram for malignant neoplasm of breast: Secondary | ICD-10-CM

## 2024-04-29 ENCOUNTER — Ambulatory Visit
Admission: RE | Admit: 2024-04-29 | Discharge: 2024-04-29 | Disposition: A | Discharge status: Home / Self Care | Source: Ambulatory Visit | Attending: General Practice | Admitting: General Practice

## 2024-04-29 ENCOUNTER — Encounter

## 2024-04-29 DIAGNOSIS — Z1231 Encounter for screening mammogram for malignant neoplasm of breast: Secondary | ICD-10-CM

## 2024-05-03 ENCOUNTER — Telehealth: Payer: Self-pay | Admitting: General Practice

## 2024-05-03 ENCOUNTER — Other Ambulatory Visit: Payer: Self-pay | Admitting: General Practice

## 2024-05-03 DIAGNOSIS — R928 Other abnormal and inconclusive findings on diagnostic imaging of breast: Secondary | ICD-10-CM

## 2024-05-03 NOTE — Telephone Encounter (Signed)
 Order printed

## 2024-05-03 NOTE — Telephone Encounter (Signed)
 Received fax for urgent order from [facility] for pt, that is requiring a signature. Order is in S drive.

## 2024-05-03 NOTE — Telephone Encounter (Signed)
 Please advise if ok to transfer care.  Copied from CRM [id number]. Topic: Appointments - Scheduling Inquiry for Clinic >> May 03, 2024 11:40 AM Chiquita SQUIBB wrote: Reason for CRM: Patient is calling in regarding switching providers to Dr. Avelina, patients mother Daniya Aramburo sees Dr. Avelina and stated she would be okay seeing the daughter as well, please advise the patient if she can switch providers. OK to leave a VM

## 2024-05-03 NOTE — Telephone Encounter (Signed)
 Order has been signed by Adina Crandall, NP and faxed back to breast center.

## 2024-05-07 ENCOUNTER — Ambulatory Visit: Payer: Self-pay | Admitting: Family

## 2024-05-07 NOTE — Progress Notes (Signed)
 noted

## 2024-05-14 ENCOUNTER — Ambulatory Visit
Admission: RE | Admit: 2024-05-14 | Discharge: 2024-05-14 | Disposition: A | Discharge status: Home / Self Care | Source: Ambulatory Visit | Attending: General Practice | Admitting: General Practice

## 2024-05-14 ENCOUNTER — Inpatient Hospital Stay
Admission: RE | Admit: 2024-05-14 | Discharge: 2024-05-14 | Discharge status: Home / Self Care | Source: Ambulatory Visit | Attending: General Practice | Admitting: General Practice

## 2024-05-14 ENCOUNTER — Other Ambulatory Visit: Payer: Self-pay | Admitting: General Practice

## 2024-05-14 DIAGNOSIS — R928 Other abnormal and inconclusive findings on diagnostic imaging of breast: Secondary | ICD-10-CM

## 2024-05-14 DIAGNOSIS — N6320 Unspecified lump in the left breast, unspecified quadrant: Secondary | ICD-10-CM

## 2024-05-15 ENCOUNTER — Encounter: Payer: Self-pay | Admitting: Family Medicine

## 2024-05-17 NOTE — Telephone Encounter (Signed)
 I think she was saying that her mammogram results would go to [facility] instead of you.   Either way they are in Epic.  Her mammogram was abnormal and biopsy is scheduled on 05/21/2024.  She has transfer of care appointment with you on 05/29/24.

## 2024-05-21 ENCOUNTER — Ambulatory Visit
Admission: RE | Admit: 2024-05-21 | Discharge: 2024-05-21 | Disposition: A | Discharge status: Home / Self Care | Source: Ambulatory Visit | Attending: General Practice | Admitting: General Practice

## 2024-05-21 DIAGNOSIS — N6320 Unspecified lump in the left breast, unspecified quadrant: Secondary | ICD-10-CM

## 2024-05-21 NOTE — Telephone Encounter (Signed)
 Noted

## 2024-05-22 LAB — SURGICAL PATHOLOGY

## 2024-05-29 ENCOUNTER — Encounter: Payer: Self-pay | Admitting: Family Medicine

## 2024-05-29 ENCOUNTER — Ambulatory Visit: Admitting: Family Medicine

## 2024-05-29 VITALS — BP 128/82 | HR 83 | Temp 98.8°F | Ht <= 58 in | Wt 263.8 lb

## 2024-05-29 DIAGNOSIS — Z6841 Body Mass Index (BMI) 40.0 and over, adult: Secondary | ICD-10-CM

## 2024-05-29 DIAGNOSIS — R809 Proteinuria, unspecified: Secondary | ICD-10-CM

## 2024-05-29 DIAGNOSIS — L0390 Cellulitis, unspecified: Secondary | ICD-10-CM

## 2024-05-29 DIAGNOSIS — E1165 Type 2 diabetes mellitus with hyperglycemia: Secondary | ICD-10-CM

## 2024-05-29 DIAGNOSIS — E785 Hyperlipidemia, unspecified: Secondary | ICD-10-CM

## 2024-05-29 DIAGNOSIS — Z862 Personal history of diseases of the blood and blood-forming organs and certain disorders involving the immune mechanism: Secondary | ICD-10-CM

## 2024-05-29 DIAGNOSIS — R7989 Other specified abnormal findings of blood chemistry: Secondary | ICD-10-CM

## 2024-05-29 DIAGNOSIS — E1169 Type 2 diabetes mellitus with other specified complication: Secondary | ICD-10-CM | POA: Diagnosis not present

## 2024-05-29 DIAGNOSIS — J4520 Mild intermittent asthma, uncomplicated: Secondary | ICD-10-CM

## 2024-05-29 DIAGNOSIS — E1159 Type 2 diabetes mellitus with other circulatory complications: Secondary | ICD-10-CM

## 2024-05-29 DIAGNOSIS — I152 Hypertension secondary to endocrine disorders: Secondary | ICD-10-CM

## 2024-05-29 DIAGNOSIS — E1129 Type 2 diabetes mellitus with other diabetic kidney complication: Secondary | ICD-10-CM

## 2024-05-29 NOTE — Patient Instructions (Addendum)
 Goals .. Increase activity. Add 10-15 min a day.   Walking or hula hoop belt.  Less processed food more  whole food, low carb, decrease snacking.

## 2024-05-29 NOTE — Assessment & Plan Note (Signed)
 Noted x 1   Will recheck after pt made aggressive lifestyle changes.  Does not want to start SGLT2. Already on ARB.

## 2024-05-29 NOTE — Assessment & Plan Note (Signed)
 Encouraged exercise, weight loss, healthy eating habits. ? ?

## 2024-05-29 NOTE — Assessment & Plan Note (Signed)
 In past, repeat eval normal.

## 2024-05-29 NOTE — Assessment & Plan Note (Addendum)
 Acute, likely fatty liver... recheck after lifestyle changes.  She does take iron for a long time for past iron def anemia, no current heavy menses on OCPs.

## 2024-05-29 NOTE — Progress Notes (Addendum)
 Patient ID: [name], female    DOB: 10/01/1981, 43 y.o.   MRN: [id number]  This visit was conducted in person.  BP 128/82   Pulse 83   Temp 98.8 F (37.1 C) (Oral)   Ht 4' 9.9 (1.471 m)   Wt 263 lb 12.8 oz (119.7 kg)   SpO2 98%   BMI 55.32 kg/m    CC:  Chief Complaint  Patient presents with   Transfer of Care    Transfer from [facility] to [facility]    Subjective:   HPI: [name] is a 43 y.o. female presenting on 05/29/2024 for Transfer of Care (Transfer from [facility] to [facility])   Transferring care given mother [name] is also my patient.  Last CPX:  04/18/2024  Past PCP Dr. Candise GYN: Dr. Lovetta.  Has had a very stressful last year.  She has gained 30 lbs since COVID,  has gained 15-20 in last year.   Ovarian cyst, left, removed was 13 cm. Benign.  Hypertension: Diagnosed many years ago. Currently managed on Bisoprolol -hydrochlorothiazide  10-6.25 mg once daily and olmesartan  40 mg once daily.   Using medication without problems or lightheadedness:  Chest pain with exertion: Edema: Short of breath: Average home BPs: Other issues:  Elevated Cholesterol:  Started on rosuvastatin  5 mg daily on June 2025 Lab Results  Component Value Date   CHOL 206 (H) 04/18/2024   HDL 52.60 04/18/2024   LDLCALC 105 (H) 04/18/2024   TRIG 246.0 (H) 04/18/2024   CHOLHDL 4 04/18/2024  Using medications without problems: Muscle aches:  Diet compliance: Exercise: minimal Other complaints:  Diabetes:  Diet controlled, not currently interested in nutrition referral... has seen in past.  She is not interested in starting metformin  yet.  Microalbumin was elevated... started on jardiance , not covered changed to Farxiga  but she does not want to use at this time until lifestyle changes made and microalbumin rechecked. Lab Results  Component Value Date   HGBA1C 6.5 03/27/2024  Using medications without difficulties: Hypoglycemic episodes: Hyperglycemic  episodes: Feet problems: none Blood Sugars averaging: eye exam within last year: yes  Mild intermittent asthma: stable, uses albuterol  prn.   Elevated LFTs in past... no past.  No family history of liver issues.  No exposure to hepatitis.  She is on longtime iron for iron de anemia.. hg nml  Menorrhagia .SABRA Controlled with OCPs.  Relevant past medical, surgical, family and social history reviewed and updated as indicated. Interim medical history since our last visit reviewed. Allergies and medications reviewed and updated. Allergies as of 05/29/2024       Reactions   Shellfish Allergy Anaphylaxis   Aspirin    Stomach pain/ burning    Avelox [moxifloxacin Hcl In Nacl]    hives   Azithromycin     Flu like symptoms   Ibuprofen    Stomach pain    Naproxen Sodium Other (See Comments)   Stomach pain        Medication List        Accurate as of May 29, 2024  1:50 PM. If you have any questions, ask your nurse or doctor.          STOP taking these medications    dapagliflozin  propanediol 5 MG Tabs tablet Commonly known as: Farxiga  Stopped by: Greig Ring       TAKE these medications    albuterol  108 (90 Base) MCG/ACT inhaler Commonly known as: VENTOLIN  HFA Inhale 2 puffs into the lungs every 6 (six)  hours as needed for wheezing or shortness of breath.   ALIGN DUALBIOTIC PO Take 1 capsule by mouth at bedtime.   BERBERINE HCI PO Take 1,000 mg by mouth daily.   Biotin 2500 MCG Chew Chew 1 tablet by mouth at bedtime.   bisoprolol -hydrochlorothiazide  10-6.25 MG tablet Commonly known as: ZIAC  TAKE 1 TABLET BY MOUTH EVERY MORNING FOR BLOOD PRESSURE   cetirizine 10 MG tablet Commonly known as: ZYRTEC Take 10 mg by mouth at bedtime.   doxycycline  100 MG capsule Commonly known as: VIBRAMYCIN  Take 1 capsule Daily with Food for Hidradenitis Suppurativa Prophylaxis   FIBER ADULT GUMMIES PO Take 1 tablet by mouth at bedtime.   ipratropium-albuterol  0.5-2.5  (3) MG/3ML Soln Commonly known as: DUONEB Inhale into the lungs.   Iron 325 (65 Fe) MG Tabs Take 2 tablets by mouth at bedtime.   montelukast  10 MG tablet Commonly known as: SINGULAIR  TAKE ONE TABLET BY MOUTH DAILY FOR ALLERGIES   MULTIPLE VITAMIN PO Take 1 tablet by mouth at bedtime.   Norgestimate -Eth Estradiol  0.18/0.215/0.25 MG-25 MCG Tabs Commonly known as: Tri-Lo-Marzia Take 1 tablet by mouth daily.   olmesartan  40 MG tablet Commonly known as: BENICAR  Take 1 tablet (40 mg total) by mouth daily.   omeprazole 20 MG capsule Commonly known as: PRILOSEC Take 20 mg by mouth daily.   rosuvastatin  5 MG tablet Commonly known as: CRESTOR  Take 1 tablet (5 mg total) by mouth daily.   TOTAL MEMORY & FOCUS FORMULA PO Take by mouth.   triamcinolone  ointment 0.5 % Commonly known as: KENALOG  Apply 1 Application topically as needed.           Per HPI unless specifically indicated in ROS section below Review of Systems  Constitutional:  Negative for fatigue and fever.  HENT:  Negative for congestion.   Eyes:  Negative for pain.  Respiratory:  Negative for cough and shortness of breath.   Cardiovascular:  Negative for chest pain, palpitations and leg swelling.  Gastrointestinal:  Negative for abdominal pain.  Genitourinary:  Negative for dysuria and vaginal bleeding.  Musculoskeletal:  Negative for back pain.  Neurological:  Negative for syncope, light-headedness and headaches.  Psychiatric/Behavioral:  Negative for dysphoric mood.    Objective:  BP 128/82   Pulse 83   Temp 98.8 F (37.1 C) (Oral)   Ht 4' 9.9 (1.471 m)   Wt 263 lb 12.8 oz (119.7 kg)   SpO2 98%   BMI 55.32 kg/m   Wt Readings from Last 3 Encounters:  05/29/24 263 lb 12.8 oz (119.7 kg)  04/18/24 266 lb (120.7 kg)  03/27/24 270 lb (122.5 kg)      Physical Exam Constitutional:      General: She is not in acute distress.    Appearance: Normal appearance. She is well-developed. She is not  ill-appearing or toxic-appearing.  HENT:     Head: Normocephalic.     Right Ear: Hearing, tympanic membrane, ear canal and external ear normal. Tympanic membrane is not erythematous, retracted or bulging.     Left Ear: Hearing, tympanic membrane, ear canal and external ear normal. Tympanic membrane is not erythematous, retracted or bulging.     Nose: No mucosal edema or rhinorrhea.     Right Sinus: No maxillary sinus tenderness or frontal sinus tenderness.     Left Sinus: No maxillary sinus tenderness or frontal sinus tenderness.     Mouth/Throat:     Pharynx: Uvula midline.  Eyes:     General: Lids are  normal. Lids are everted, no foreign bodies appreciated.     Conjunctiva/sclera: Conjunctivae normal.     Pupils: Pupils are equal, round, and reactive to light.  Neck:     Thyroid: No thyroid mass or thyromegaly.     Vascular: No carotid bruit.     Trachea: Trachea normal.  Cardiovascular:     Rate and Rhythm: Normal rate and regular rhythm.     Pulses: Normal pulses.     Heart sounds: Normal heart sounds, S1 normal and S2 normal. No murmur heard.    No friction rub. No gallop.  Pulmonary:     Effort: Pulmonary effort is normal. No tachypnea or respiratory distress.     Breath sounds: Normal breath sounds. No decreased breath sounds, wheezing, rhonchi or rales.  Abdominal:     General: Bowel sounds are normal.     Palpations: Abdomen is soft.     Tenderness: There is no abdominal tenderness.  Musculoskeletal:     Cervical back: Normal range of motion and neck supple.  Skin:    General: Skin is warm and dry.     Findings: No rash.  Neurological:     Mental Status: She is alert.  Psychiatric:        Mood and Affect: Mood is not anxious or depressed.        Speech: Speech normal.        Behavior: Behavior normal. Behavior is cooperative.        Thought Content: Thought content normal.        Judgment: Judgment normal.       Results for orders placed or performed during the  hospital encounter of 05/21/24  Surgical pathology   Collection Time: 05/21/24 12:00 AM  Result Value Ref Range   SURGICAL PATHOLOGY      SURGICAL PATHOLOGY [facility] [address] [place], [place] [address] Telephone [phone] or [phone] Fax [phone]  REPORT OF SURGICAL PATHOLOGY   Accession #: [id number] Patient Name: KEMI, GELL Visit # : [id number]  MRN: [id number] Physician: Jerilynn Debby BRAVO. DOB/Age [birth year] (Age: 52) Gender: F Collected Date: 05/21/2024 Received Date: 05/21/2024  FINAL DIAGNOSIS       1. Breast, left, needle core biopsy, upper inner subareolar, 10 o'clock, ribbon clip :       - FIBROADENOMA.      - NO MALIGNANCY IDENTIFIED.       DATE SIGNED OUT: 05/22/2024 ELECTRONIC SIGNATURE BETHA Pottier M.D., Nupur, Pathologist, Electronic Signature  MICROSCOPIC DESCRIPTION  CASE COMMENTS STAINS USED IN DIAGNOSIS: H&E-2 H&E-3 H&E-4 H&E    CLINICAL HISTORY  SPECIMEN(S) OBTAINED 1. Breast, left, needle core biopsy, Upper Inner Subareolar, 10 O'clock, Ribbon Clip  SPECIMEN COMMENTS: 1. TIF: 8:05 AM, CIT < 1 minute; ba seline screening detected indeterminate 0.7cm mass SPECIMEN CLINICAL INFORMATION: 1. Papilloma vs duct ectasia with inspissated debris vs fibroadenoma vs PASH vs DCIS    Gross Description 1. Received labeled with the patient name Dessire Grimes and left breast 10 o'clock subareolar upper inner are three cores of tan-yellow fibrofatty tissue averaging 1.2 cm in length x 0.2 cm in diameter.  The specimen is entirely submitted in 1A.      TIF 8:05 a.m. on 05/21/24. CIT less than 1 min. (WC:gt, 05/21/24)        Report signed out from the following location(s) [place] [address] [place], [place] [address] CLIA #: [id number]  [facility]  [address] [place], [place] [address] CLIA #: [id number]     Assessment and  Plan  Hypertension associated with diabetes (HCC) Assessment & Plan: Stable, chronic.  Continue current medication.  Bisoprolol /hydrochlorothiazide  10/6.25 mg daily Olmesartan  40 mg daily   Morbid obesity (HCC) Assessment & Plan: Encouraged exercise, weight loss, healthy eating habits.    Hyperlipidemia associated with type 2 diabetes mellitus (HCC) Assessment & Plan:   Associated with diabetes. Goal LDL < 70  Started on statin 2 months ago.. rosuvastatin  5 mg daily.  Due for re-eval in next few months.   Type 2 diabetes mellitus with hyperglycemia, unspecified whether long term insulin use [facility]) Assessment & Plan:  New diagnosis this year,  Hemoglobin A1c 6.5 on 03/27/24.  Due for re-eval in 07/2024 Discussed the importance of monitoring diet and exercise.  She declines medication and nutrition referral at this time.    Orders: -     Hemoglobin A1c; Future -     Lipid panel; Future -     Comprehensive metabolic panel with GFR; Future -     Microalbumin / creatinine urine ratio; Future  Mild intermittent asthma without complication Assessment & Plan: Stable, chronic.  Continue current medication.  Continue Albuterol  inhaler and nebulizer as needed.     Microalbuminuria due to type 2 diabetes mellitus (HCC) Assessment & Plan:  Noted x 1   Will recheck after pt made aggressive lifestyle changes.  Does not want to start SGLT2. Already on ARB.   Elevated LFTs Assessment & Plan:  Acute, likely fatty liver... recheck after lifestyle changes.  She does take iron for a long time for past iron def anemia, no current heavy menses on OCPs.   Recurrent cellulitis Assessment & Plan:  Chronic, Has been on daily low dose doxy for 2 years given recurrent boils/cellulitis.   Abnormal TSH Assessment & Plan:  In past, repeat eval normal.   History of anemia Assessment & Plan: Controlled on iron.     Return for move back lab appt until  mid October, with follow up  appt  after.   Greig Ring, MD

## 2024-05-29 NOTE — Addendum Note (Signed)
 Addended by: AVELINA NO E on: 05/29/2024 01:51 PM   Modules accepted: Orders

## 2024-05-29 NOTE — Assessment & Plan Note (Signed)
 Chronic, Has been on daily low dose doxy for 2 years given recurrent boils/cellulitis.

## 2024-05-29 NOTE — Assessment & Plan Note (Signed)
 Stable, chronic.  Continue current medication.  Continue Albuterol  inhaler and nebulizer as needed.

## 2024-05-29 NOTE — Assessment & Plan Note (Addendum)
 Associated with diabetes. Goal LDL < 70  Started on statin 2 months ago.. rosuvastatin  5 mg daily.  Due for re-eval in next few months.

## 2024-05-29 NOTE — Assessment & Plan Note (Signed)
 Stable, chronic.  Continue current medication.  Bisoprolol /hydrochlorothiazide  10/6.25 mg daily Olmesartan  40 mg daily

## 2024-05-29 NOTE — Assessment & Plan Note (Addendum)
 New diagnosis this year,  Hemoglobin A1c 6.5 on 03/27/24.  Due for re-eval in 07/2024 Discussed the importance of monitoring diet and exercise.  She declines medication and nutrition referral at this time.

## 2024-05-29 NOTE — Assessment & Plan Note (Signed)
 Controlled on iron.

## 2024-06-21 ENCOUNTER — Other Ambulatory Visit: Payer: Self-pay | Admitting: General Practice

## 2024-06-21 DIAGNOSIS — Z9109 Other allergy status, other than to drugs and biological substances: Secondary | ICD-10-CM

## 2024-06-21 DIAGNOSIS — I10 Essential (primary) hypertension: Secondary | ICD-10-CM

## 2024-07-02 ENCOUNTER — Other Ambulatory Visit

## 2024-07-05 ENCOUNTER — Other Ambulatory Visit: Payer: Self-pay | Admitting: Family

## 2024-07-05 DIAGNOSIS — L732 Hidradenitis suppurativa: Secondary | ICD-10-CM

## 2024-07-09 ENCOUNTER — Encounter: Payer: Self-pay | Admitting: Family Medicine

## 2024-07-09 DIAGNOSIS — L732 Hidradenitis suppurativa: Secondary | ICD-10-CM

## 2024-07-10 ENCOUNTER — Other Ambulatory Visit: Payer: Self-pay | Admitting: Family

## 2024-07-10 DIAGNOSIS — L732 Hidradenitis suppurativa: Secondary | ICD-10-CM

## 2024-07-10 MED ORDER — DOXYCYCLINE HYCLATE 100 MG PO CAPS: ORAL_CAPSULE | ORAL | 3 refills | Status: DC

## 2024-07-10 NOTE — Telephone Encounter (Signed)
 For some reason this was sent to [facility]. Ok to refill as pended.

## 2024-07-11 MED ORDER — EMPAGLIFLOZIN 10 MG PO TABS: 10.0000 mg | ORAL_TABLET | Freq: Every day | ORAL | 11 refills | Status: DC

## 2024-07-11 NOTE — Addendum Note (Signed)
 Addended byBETHA AVELINA NO E on: 07/11/2024 01:23 PM   Modules accepted: Orders

## 2024-07-11 NOTE — Telephone Encounter (Signed)
 Jardiance  not on current medication list,.  Okay to refill?

## 2024-08-14 ENCOUNTER — Other Ambulatory Visit (INDEPENDENT_AMBULATORY_CARE_PROVIDER_SITE_OTHER)

## 2024-08-14 ENCOUNTER — Ambulatory Visit: Payer: Self-pay | Admitting: Family Medicine

## 2024-08-14 DIAGNOSIS — E1165 Type 2 diabetes mellitus with hyperglycemia: Secondary | ICD-10-CM | POA: Diagnosis not present

## 2024-08-14 LAB — LIPID PANEL
Cholesterol: 196 mg/dL (ref 0–200)
HDL: 46.9 mg/dL (ref >39.00)
LDL Cholesterol: 79 mg/dL (ref 0–99)
NonHDL: 148.74
Total CHOL/HDL Ratio: 4
Triglycerides: 348 mg/dL — ABNORMAL HIGH (ref 0.0–149.0)
VLDL: 69.6 mg/dL — ABNORMAL HIGH (ref 0.0–40.0)

## 2024-08-14 LAB — COMPREHENSIVE METABOLIC PANEL WITH GFR
ALT: 27 U/L (ref 0–35)
AST: 29 U/L (ref 0–37)
Albumin: 4.2 g/dL (ref 3.5–5.2)
Alkaline Phosphatase: 66 U/L (ref 39–117)
BUN: 14 mg/dL (ref 6–23)
CO2: 26 meq/L (ref 19–32)
Calcium: 9.5 mg/dL (ref 8.4–10.5)
Chloride: 99 meq/L (ref 96–112)
Creatinine, Ser: 0.77 mg/dL (ref 0.40–1.20)
GFR: 94.71 mL/min (ref >60.00)
Glucose, Bld: 116 mg/dL — ABNORMAL HIGH (ref 70–99)
Potassium: 3.9 meq/L (ref 3.5–5.1)
Sodium: 137 meq/L (ref 135–145)
Total Bilirubin: 0.4 mg/dL (ref 0.2–1.2)
Total Protein: 7.3 g/dL (ref 6.0–8.3)

## 2024-08-14 LAB — MICROALBUMIN / CREATININE URINE RATIO
Creatinine,U: 108.7 mg/dL
Microalb Creat Ratio: 69 mg/g — ABNORMAL HIGH (ref 0.0–30.0)
Microalb, Ur: 7.5 mg/dL — ABNORMAL HIGH (ref 0.0–1.9)

## 2024-08-14 LAB — HEMOGLOBIN A1C: Hgb A1c MFr Bld: 6.5 % (ref 4.6–6.5)

## 2024-08-14 NOTE — Progress Notes (Signed)
 No critical labs need to be addressed urgently. We will discuss labs in detail at upcoming office visit.

## 2024-08-20 ENCOUNTER — Ambulatory Visit (INDEPENDENT_AMBULATORY_CARE_PROVIDER_SITE_OTHER): Admitting: Family Medicine

## 2024-08-20 ENCOUNTER — Encounter: Payer: Self-pay | Admitting: Family Medicine

## 2024-08-20 VITALS — BP 130/88 | HR 79 | Temp 97.8°F | Ht <= 58 in | Wt 259.2 lb

## 2024-08-20 DIAGNOSIS — R809 Proteinuria, unspecified: Secondary | ICD-10-CM

## 2024-08-20 DIAGNOSIS — E1169 Type 2 diabetes mellitus with other specified complication: Secondary | ICD-10-CM

## 2024-08-20 DIAGNOSIS — E1129 Type 2 diabetes mellitus with other diabetic kidney complication: Secondary | ICD-10-CM

## 2024-08-20 DIAGNOSIS — Z6841 Body Mass Index (BMI) 40.0 and over, adult: Secondary | ICD-10-CM | POA: Diagnosis not present

## 2024-08-20 DIAGNOSIS — E785 Hyperlipidemia, unspecified: Secondary | ICD-10-CM

## 2024-08-20 DIAGNOSIS — E1159 Type 2 diabetes mellitus with other circulatory complications: Secondary | ICD-10-CM

## 2024-08-20 DIAGNOSIS — I152 Hypertension secondary to endocrine disorders: Secondary | ICD-10-CM

## 2024-08-20 DIAGNOSIS — E1165 Type 2 diabetes mellitus with hyperglycemia: Secondary | ICD-10-CM

## 2024-08-20 MED ORDER — EMPAGLIFLOZIN 25 MG PO TABS: 25.0000 mg | ORAL_TABLET | Freq: Every day | ORAL | 3 refills | Status: AC

## 2024-08-20 NOTE — Assessment & Plan Note (Addendum)
 Persistently elevated x 2.   On SGLT2i .[name] Will increase to 25 mg daily

## 2024-08-20 NOTE — Assessment & Plan Note (Signed)
 Associated with diabetes. Goal LDL < 70  Excellent control on  rosuvastatin  5 mg daily.

## 2024-08-20 NOTE — Assessment & Plan Note (Signed)
 Encouraged exercise, weight loss, healthy eating habits. ? ?

## 2024-08-20 NOTE — Assessment & Plan Note (Signed)
 Stable, chronic.  Continue current medication.  Bisoprolol /hydrochlorothiazide  10/6.25 mg daily Olmesartan  40 mg daily

## 2024-08-20 NOTE — Assessment & Plan Note (Addendum)
 New diagnosis this year,  Hemoglobin A1c 6.5 on 03/27/24.   Stable on recheck. Discussed the importance of monitoring diet and exercise.   Doing well on Jardiance  10 mg daily... will increase  to 25 mg daily... re-eval in 3 months.

## 2024-08-20 NOTE — Progress Notes (Signed)
 Patient ID: [name], female    DOB: 1981-10-11, 43 y.o.   MRN: [id number]  This visit was conducted in person.  BP 130/88   Pulse 79   Temp 97.8 F (36.6 C) (Temporal)   Ht 4' 9.9 (1.471 m)   Wt 259 lb 4 oz (117.6 kg)   BMI 54.37 kg/m    CC:  Chief Complaint  Patient presents with   Diabetes    Subjective:   HPI: [name] is a 43 y.o. female presenting on 08/20/2024 for Diabetes  Hypertension: Diagnosed many years ago. Currently managed on Bisoprolol -hydrochlorothiazide  10-6.25 mg once daily and olmesartan  40 mg once daily.   BP Readings from Last 3 Encounters:  08/20/24 130/88  05/29/24 128/82  04/18/24 128/80  Using medication without problems or lightheadedness:  none Chest pain with exertion: none Edema:none Short of breath:none Average home BPs: 120/60-70. Other issues:  Elevated Cholesterol:  Started on rosuvastatin  5 mg daily on June 2025 Lab Results  Component Value Date   CHOL 196 08/14/2024   HDL 46.90 08/14/2024   LDLCALC 79 08/14/2024   TRIG 348.0 (H) 08/14/2024   CHOLHDL 4 08/14/2024  Using medications without problems: Muscle aches:  Diet compliance: Exercise: minimal Other complaints:  Diabetes:   She started jardiance   10 mg daily... sime increasing urine outpout, no UTI or vaginal infection.  Microalbumin was elevated... started on jardiance , now on recheck it is again elevated. Lab Results  Component Value Date   HGBA1C 6.5 08/14/2024  Using medications without difficulties: Hypoglycemic episodes: Hyperglycemic episodes: Feet problems: none Blood Sugars averaging: eye exam within last year: yes  Mild intermittent asthma: stable, uses albuterol  prn.   Elevated LFTs in past... now resolved.  No family history of liver issues.  No exposure to hepatitis.  She is on longtime iron for iron de anemia.. hg nml  Menorrhagia .SABRA Controlled with OCPs.  Relevant past medical, surgical, family and social history reviewed and  updated as indicated. Interim medical history since our last visit reviewed. Allergies and medications reviewed and updated. Allergies as of 08/20/2024       Reactions   Shellfish Allergy Anaphylaxis   Aspirin    Stomach pain/ burning    Avelox [moxifloxacin Hcl In Nacl]    hives   Azithromycin     Flu like symptoms   Ibuprofen    Stomach pain    Naproxen Sodium Other (See Comments)   Stomach pain        Medication List        Accurate as of August 20, 2024  8:53 AM. If you have any questions, ask your nurse or doctor.          albuterol  108 (90 Base) MCG/ACT inhaler Commonly known as: VENTOLIN  HFA Inhale 2 puffs into the lungs every 6 (six) hours as needed for wheezing or shortness of breath.   ALIGN DUALBIOTIC PO Take 1 capsule by mouth at bedtime.   BERBERINE HCI PO Take 1,000 mg by mouth daily.   Biotin 2500 MCG Chew Chew 1 tablet by mouth at bedtime.   bisoprolol -hydrochlorothiazide  10-6.25 MG tablet Commonly known as: ZIAC  TAKE 1 TABLET BY MOUTH EVERY MORNING FOR BLOOD PRESSURE   cetirizine 10 MG tablet Commonly known as: ZYRTEC Take 10 mg by mouth at bedtime.   doxycycline  100 MG capsule Commonly known as: VIBRAMYCIN  Take 1 capsule Daily with Food for Hidradenitis Suppurativa Prophylaxis   empagliflozin  25 MG Tabs tablet Commonly known as: JARDIANCE  Take 1  tablet (25 mg total) by mouth daily. What changed:  medication strength how much to take Changed by: Yusuf Yu   FIBER ADULT GUMMIES PO Take 1 tablet by mouth at bedtime.   ipratropium-albuterol  0.5-2.5 (3) MG/3ML Soln Commonly known as: DUONEB Inhale into the lungs.   Iron 325 (65 Fe) MG Tabs Take 2 tablets by mouth at bedtime.   montelukast  10 MG tablet Commonly known as: SINGULAIR  TAKE 1 TABLET BY MOUTH DAILY FOR ALLERGIES   MULTIPLE VITAMIN PO Take 1 tablet by mouth at bedtime.   Norgestimate -Eth Estradiol  0.18/0.215/0.25 MG-25 MCG Tabs Commonly known as:  Tri-Lo-Marzia Take 1 tablet by mouth daily.   olmesartan  40 MG tablet Commonly known as: BENICAR  TAKE 1 TABLET BY MOUTH DAILY   omeprazole 20 MG capsule Commonly known as: PRILOSEC Take 20 mg by mouth daily.   rosuvastatin  5 MG tablet Commonly known as: CRESTOR  Take 1 tablet (5 mg total) by mouth daily.   TOTAL MEMORY & FOCUS FORMULA PO Take by mouth.   triamcinolone  ointment 0.5 % Commonly known as: KENALOG  Apply 1 Application topically as needed.           Per HPI unless specifically indicated in ROS section below Review of Systems  Constitutional:  Negative for fatigue and fever.  HENT:  Negative for congestion.   Eyes:  Negative for pain.  Respiratory:  Negative for cough and shortness of breath.   Cardiovascular:  Negative for chest pain, palpitations and leg swelling.  Gastrointestinal:  Negative for abdominal pain.  Genitourinary:  Negative for dysuria and vaginal bleeding.  Musculoskeletal:  Negative for back pain.  Neurological:  Negative for syncope, light-headedness and headaches.  Psychiatric/Behavioral:  Negative for dysphoric mood.    Objective:  BP 130/88   Pulse 79   Temp 97.8 F (36.6 C) (Temporal)   Ht 4' 9.9 (1.471 m)   Wt 259 lb 4 oz (117.6 kg)   BMI 54.37 kg/m   Wt Readings from Last 3 Encounters:  08/20/24 259 lb 4 oz (117.6 kg)  05/29/24 263 lb 12.8 oz (119.7 kg)  04/18/24 266 lb (120.7 kg)      Physical Exam Constitutional:      General: She is not in acute distress.    Appearance: Normal appearance. She is well-developed. She is not ill-appearing or toxic-appearing.  HENT:     Head: Normocephalic.     Right Ear: Hearing, tympanic membrane, ear canal and external ear normal. Tympanic membrane is not erythematous, retracted or bulging.     Left Ear: Hearing, tympanic membrane, ear canal and external ear normal. Tympanic membrane is not erythematous, retracted or bulging.     Nose: No mucosal edema or rhinorrhea.     Right Sinus:  No maxillary sinus tenderness or frontal sinus tenderness.     Left Sinus: No maxillary sinus tenderness or frontal sinus tenderness.     Mouth/Throat:     Pharynx: Uvula midline.  Eyes:     General: Lids are normal. Lids are everted, no foreign bodies appreciated.     Conjunctiva/sclera: Conjunctivae normal.     Pupils: Pupils are equal, round, and reactive to light.  Neck:     Thyroid: No thyroid mass or thyromegaly.     Vascular: No carotid bruit.     Trachea: Trachea normal.  Cardiovascular:     Rate and Rhythm: Normal rate and regular rhythm.     Pulses: Normal pulses.     Heart sounds: Normal heart sounds, S1 normal  and S2 normal. No murmur heard.    No friction rub. No gallop.  Pulmonary:     Effort: Pulmonary effort is normal. No tachypnea or respiratory distress.     Breath sounds: Normal breath sounds. No decreased breath sounds, wheezing, rhonchi or rales.  Abdominal:     General: Bowel sounds are normal.     Palpations: Abdomen is soft.     Tenderness: There is no abdominal tenderness.  Musculoskeletal:     Cervical back: Normal range of motion and neck supple.  Skin:    General: Skin is warm and dry.     Findings: No rash.  Neurological:     Mental Status: She is alert.  Psychiatric:        Mood and Affect: Mood is not anxious or depressed.        Speech: Speech normal.        Behavior: Behavior normal. Behavior is cooperative.        Thought Content: Thought content normal.        Judgment: Judgment normal.       Results for orders placed or performed in visit on 08/14/24  Microalbumin / creatinine urine ratio   Collection Time: 08/14/24  8:08 AM  Result Value Ref Range   Microalb, Ur 7.5 (H) 0.0 - 1.9 mg/dL   Creatinine,U 891.2 mg/dL   Microalb Creat Ratio 69.0 (H) 0.0 - 30.0 mg/g  Comprehensive metabolic panel with GFR   Collection Time: 08/14/24  8:08 AM  Result Value Ref Range   Sodium 137 135 - 145 mEq/L   Potassium 3.9 3.5 - 5.1 mEq/L   Chloride  99 96 - 112 mEq/L   CO2 26 19 - 32 mEq/L   Glucose, Bld 116 (H) 70 - 99 mg/dL   BUN 14 6 - 23 mg/dL   Creatinine, Ser 9.22 0.40 - 1.20 mg/dL   Total Bilirubin 0.4 0.2 - 1.2 mg/dL   Alkaline Phosphatase 66 39 - 117 U/L   AST 29 0 - 37 U/L   ALT 27 0 - 35 U/L   Total Protein 7.3 6.0 - 8.3 g/dL   Albumin 4.2 3.5 - 5.2 g/dL   GFR 05.28 >39.99 mL/min   Calcium  9.5 8.4 - 10.5 mg/dL  Lipid panel   Collection Time: 08/14/24  8:08 AM  Result Value Ref Range   Cholesterol 196 0 - 200 mg/dL   Triglycerides 651.9 (H) 0.0 - 149.0 mg/dL   HDL 53.09 >60.99 mg/dL   VLDL 30.3 (H) 0.0 - 59.9 mg/dL   LDL Cholesterol 79 0 - 99 mg/dL   Total CHOL/HDL Ratio 4    NonHDL 148.74   Hemoglobin A1c   Collection Time: 08/14/24  8:08 AM  Result Value Ref Range   Hgb A1c MFr Bld 6.5 4.6 - 6.5 %    Assessment and Plan  Hypertension associated with diabetes (HCC) Assessment & Plan: Stable, chronic.  Continue current medication.  Bisoprolol /hydrochlorothiazide  10/6.25 mg daily Olmesartan  40 mg daily   Morbid obesity (HCC) Assessment & Plan: Encouraged exercise, weight loss, healthy eating habits.    Hyperlipidemia associated with type 2 diabetes mellitus (HCC) Assessment & Plan:   Associated with diabetes. Goal LDL < 70  Excellent control on  rosuvastatin  5 mg daily.     Type 2 diabetes mellitus with hyperglycemia, unspecified whether long term insulin use [facility]) Assessment & Plan:  New diagnosis this year,  Hemoglobin A1c 6.5 on 03/27/24.   Stable on recheck. Discussed the importance  of monitoring diet and exercise.   Doing well on Jardiance  10 mg daily... will increase  to 25 mg daily... re-eval in 3 months.    Microalbuminuria due to type 2 diabetes mellitus (HCC) Assessment & Plan: Persistently elevated x 2.   On SGLT2i .SABRA Will increase to 25 mg daily   Other orders -     Empagliflozin ; Take 1 tablet (25 mg total) by mouth daily.  Dispense: 90 tablet; Refill: 3     Return for  diabetes follow up with POC a1C.   Greig Ring, MD

## 2024-10-01 ENCOUNTER — Ambulatory Visit: Admitting: General Practice

## 2024-10-12 ENCOUNTER — Other Ambulatory Visit: Payer: Self-pay | Admitting: General Practice

## 2024-10-12 DIAGNOSIS — E785 Hyperlipidemia, unspecified: Secondary | ICD-10-CM

## 2024-11-03 ENCOUNTER — Other Ambulatory Visit: Payer: Self-pay | Admitting: Family Medicine

## 2024-11-03 DIAGNOSIS — L732 Hidradenitis suppurativa: Secondary | ICD-10-CM

## 2024-11-04 NOTE — Telephone Encounter (Signed)
 Last office visit 08/20/2024 for DM.  Last refilled 07/10/2024 for #30 with 3 refills. Next Appt: 11/20/2024

## 2024-11-20 ENCOUNTER — Ambulatory Visit: Admitting: Family Medicine

## 2024-11-21 ENCOUNTER — Ambulatory Visit: Admitting: Family Medicine

## 2024-11-26 ENCOUNTER — Ambulatory Visit: Admitting: Family Medicine

## 2024-11-27 ENCOUNTER — Encounter: Payer: Self-pay | Admitting: Family Medicine

## 2024-11-27 ENCOUNTER — Ambulatory Visit: Admitting: Family Medicine

## 2024-11-27 VITALS — BP 120/82 | HR 76 | Temp 99.0°F | Ht <= 58 in | Wt 264.0 lb

## 2024-11-27 DIAGNOSIS — Z6841 Body Mass Index (BMI) 40.0 and over, adult: Secondary | ICD-10-CM | POA: Diagnosis not present

## 2024-11-27 DIAGNOSIS — E1165 Type 2 diabetes mellitus with hyperglycemia: Secondary | ICD-10-CM

## 2024-11-27 DIAGNOSIS — E1159 Type 2 diabetes mellitus with other circulatory complications: Secondary | ICD-10-CM | POA: Diagnosis not present

## 2024-11-27 DIAGNOSIS — Z23 Encounter for immunization: Secondary | ICD-10-CM | POA: Diagnosis not present

## 2024-11-27 DIAGNOSIS — I152 Hypertension secondary to endocrine disorders: Secondary | ICD-10-CM | POA: Diagnosis not present

## 2024-11-27 LAB — POCT GLYCOSYLATED HEMOGLOBIN (HGB A1C): Hemoglobin A1C: 6.1 % — AB (ref 4.0–5.6)

## 2024-11-27 NOTE — Assessment & Plan Note (Signed)
 Stable, chronic.  Continue current medication.  Bisoprolol /hydrochlorothiazide  10/6.25 mg daily Olmesartan  40 mg daily

## 2024-11-27 NOTE — Progress Notes (Signed)
 "   Patient ID: [name], female    DOB: [birth year], 44 y.o.   MRN: [id number]  This visit was conducted in person.  BP 120/82   Pulse 76   Temp 99 F (37.2 C) (Oral)   Ht 4' 9.9 (1.471 m)   Wt 264 lb (119.7 kg)   SpO2 97%   BMI 55.37 kg/m    CC:  Chief Complaint  Patient presents with   Medical Management of Chronic Issues    Pt here for DM f/u. Pt has question about MMR.    Subjective:   HPI: [name] is a 44 y.o. female presenting on 11/27/2024 for Medical Management of Chronic Issues (Pt here for DM f/u. Pt has question about MMR.)  Hypertension: Diagnosed many years ago. Currently managed on Bisoprolol -hydrochlorothiazide  10-6.25 mg once daily and olmesartan  40 mg once daily.   BP Readings from Last 3 Encounters:  11/27/24 120/82  08/20/24 130/88  05/29/24 128/82  Using medication without problems or lightheadedness:  none Chest pain with exertion: none Edema:none Short of breath:none Average home BPs: 120/60-70. Other issues:  Diabetes:   jardiance   25 mg daily.. no UTI or vaginal infection.  Microalbumin was elevated... started on jardiance ,  Lab Results  Component Value Date   HGBA1C 6.1 (A) 11/27/2024  Using medications without difficulties: Hypoglycemic episodes: Hyperglycemic episodes: Feet problems: none Blood Sugars averaging: eye exam within last year: yes   Minimal exercise  Working on healthy eating habits.  Wt Readings from Last 3 Encounters:  11/27/24 264 lb (119.7 kg)  08/20/24 259 lb 4 oz (117.6 kg)  05/29/24 263 lb 12.8 oz (119.7 kg)     Relevant past medical, surgical, family and social history reviewed and updated as indicated. Interim medical history since our last visit reviewed. Allergies and medications reviewed and updated. Allergies as of 11/27/2024       Reactions   Shellfish Allergy Anaphylaxis   Aspirin    Stomach pain/ burning    Avelox [moxifloxacin Hcl In Nacl]    hives   Azithromycin     Flu like symptoms    Ibuprofen    Stomach pain    Naproxen Sodium Other (See Comments)   Stomach pain        Medication List        Accurate as of November 27, 2024  9:18 AM. If you have any questions, ask your nurse or doctor.          albuterol  108 (90 Base) MCG/ACT inhaler Commonly known as: VENTOLIN  HFA Inhale 2 puffs into the lungs every 6 (six) hours as needed for wheezing or shortness of breath.   ALIGN DUALBIOTIC PO Take 1 capsule by mouth at bedtime.   BERBERINE HCI PO Take 1,000 mg by mouth daily.   Biotin 2500 MCG Chew Chew 1 tablet by mouth at bedtime.   bisoprolol -hydrochlorothiazide  10-6.25 MG tablet Commonly known as: ZIAC  TAKE 1 TABLET BY MOUTH EVERY MORNING FOR BLOOD PRESSURE   cetirizine 10 MG tablet Commonly known as: ZYRTEC Take 10 mg by mouth at bedtime.   doxycycline  100 MG capsule Commonly known as: VIBRAMYCIN  TAKE 1 CAPSULE BY MOUTH DAILY WITH FOOD FOR HIDRADENITIS SUPPURATIVA PROPHYLAXIS   empagliflozin  25 MG Tabs tablet Commonly known as: JARDIANCE  Take 1 tablet (25 mg total) by mouth daily.   FIBER ADULT GUMMIES PO Take 1 tablet by mouth at bedtime.   ipratropium-albuterol  0.5-2.5 (3) MG/3ML Soln Commonly known as: DUONEB Inhale into the lungs.   Iron  325 (65 Fe) MG Tabs Take 2 tablets by mouth at bedtime.   montelukast  10 MG tablet Commonly known as: SINGULAIR  TAKE 1 TABLET BY MOUTH DAILY FOR ALLERGIES   MULTIPLE VITAMIN PO Take 1 tablet by mouth at bedtime.   Norgestimate -Eth Estradiol  0.18/0.215/0.25 MG-25 MCG Tabs Commonly known as: Tri-Lo-Marzia Take 1 tablet by mouth daily.   olmesartan  40 MG tablet Commonly known as: BENICAR  TAKE 1 TABLET BY MOUTH DAILY   omeprazole 20 MG capsule Commonly known as: PRILOSEC Take 20 mg by mouth daily.   rosuvastatin  5 MG tablet Commonly known as: CRESTOR  TAKE 1 TABLET BY MOUTH DAILY   TOTAL MEMORY & FOCUS FORMULA PO Take by mouth.   triamcinolone  ointment 0.5 % Commonly known as:  KENALOG  Apply 1 Application topically as needed.           Per HPI unless specifically indicated in ROS section below Review of Systems  Constitutional:  Negative for fatigue and fever.  HENT:  Negative for congestion.   Eyes:  Negative for pain.  Respiratory:  Negative for cough and shortness of breath.   Cardiovascular:  Negative for chest pain, palpitations and leg swelling.  Gastrointestinal:  Negative for abdominal pain.  Genitourinary:  Negative for dysuria and vaginal bleeding.  Musculoskeletal:  Negative for back pain.  Neurological:  Negative for syncope, light-headedness and headaches.  Psychiatric/Behavioral:  Negative for dysphoric mood.    Objective:  BP 120/82   Pulse 76   Temp 99 F (37.2 C) (Oral)   Ht 4' 9.9 (1.471 m)   Wt 264 lb (119.7 kg)   SpO2 97%   BMI 55.37 kg/m   Wt Readings from Last 3 Encounters:  11/27/24 264 lb (119.7 kg)  08/20/24 259 lb 4 oz (117.6 kg)  05/29/24 263 lb 12.8 oz (119.7 kg)      Physical Exam Constitutional:      General: She is not in acute distress.    Appearance: Normal appearance. She is well-developed. She is not ill-appearing or toxic-appearing.  HENT:     Head: Normocephalic.     Right Ear: Hearing, tympanic membrane, ear canal and external ear normal. Tympanic membrane is not erythematous, retracted or bulging.     Left Ear: Hearing, tympanic membrane, ear canal and external ear normal. Tympanic membrane is not erythematous, retracted or bulging.     Nose: No mucosal edema or rhinorrhea.     Right Sinus: No maxillary sinus tenderness or frontal sinus tenderness.     Left Sinus: No maxillary sinus tenderness or frontal sinus tenderness.     Mouth/Throat:     Pharynx: Uvula midline.  Eyes:     General: Lids are normal. Lids are everted, no foreign bodies appreciated.     Conjunctiva/sclera: Conjunctivae normal.     Pupils: Pupils are equal, round, and reactive to light.  Neck:     Thyroid: No thyroid mass or  thyromegaly.     Vascular: No carotid bruit.     Trachea: Trachea normal.  Cardiovascular:     Rate and Rhythm: Normal rate and regular rhythm.     Pulses: Normal pulses.     Heart sounds: Normal heart sounds, S1 normal and S2 normal. No murmur heard.    No friction rub. No gallop.  Pulmonary:     Effort: Pulmonary effort is normal. No tachypnea or respiratory distress.     Breath sounds: Normal breath sounds. No decreased breath sounds, wheezing, rhonchi or rales.  Abdominal:  General: Bowel sounds are normal.     Palpations: Abdomen is soft.     Tenderness: There is no abdominal tenderness.  Musculoskeletal:     Cervical back: Normal range of motion and neck supple.  Skin:    General: Skin is warm and dry.     Findings: No rash.  Neurological:     Mental Status: She is alert.  Psychiatric:        Mood and Affect: Mood is not anxious or depressed.        Speech: Speech normal.        Behavior: Behavior normal. Behavior is cooperative.        Thought Content: Thought content normal.        Judgment: Judgment normal.       Results for orders placed or performed in visit on 11/27/24  POCT glycosylated hemoglobin (Hb A1C)   Collection Time: 11/27/24  8:51 AM  Result Value Ref Range   Hemoglobin A1C 6.1 (A) 4.0 - 5.6 %   HbA1c POC (<> result, manual entry)     HbA1c, POC (prediabetic range)     HbA1c, POC (controlled diabetic range)      Assessment and Plan  Type 2 diabetes mellitus with hyperglycemia, unspecified whether long term insulin use (HCC) Assessment & Plan:  Chronic, improved control with lifestyle.  Discussed the importance of monitoring diet and exercise.   Doing well on Jardiance25 mg daily... re-eval in 6 months.   Orders: -     POCT glycosylated hemoglobin (Hb A1C)  Encounter for immunization -     Flu vaccine trivalent PF, 6mos and older(Flulaval,Afluria,Fluarix,Fluzone)  Hypertension associated with diabetes (HCC) Assessment & Plan: Stable,  chronic.  Continue current medication.  Bisoprolol /hydrochlorothiazide  10/6.25 mg daily Olmesartan  40 mg daily   Morbid obesity (HCC) Assessment & Plan: Encouraged exercise, weight loss, healthy eating habits.       Return in about 6 months (around 05/27/2025) for annual physical with fasting labs prior.   Greig Ring, MD  "

## 2024-11-27 NOTE — Assessment & Plan Note (Signed)
 Encouraged exercise, weight loss, healthy eating habits. ? ?

## 2024-11-27 NOTE — Assessment & Plan Note (Signed)
"   Chronic, improved control with lifestyle.  Discussed the importance of monitoring diet and exercise.   Doing well on Jardiance25 mg daily... re-eval in 6 months.  "

## 2024-12-04 ENCOUNTER — Ambulatory Visit: Admitting: Family Medicine

## 2025-05-27 ENCOUNTER — Other Ambulatory Visit

## 2025-06-03 ENCOUNTER — Encounter: Admitting: Family Medicine
